# Patient Record
Sex: Female | Born: 1955
Health system: Southern US, Community
[De-identification: ages and names within clinical notes are randomized; demographics above are authoritative.]

## PROBLEM LIST (undated history)

## (undated) DIAGNOSIS — E7800 Pure hypercholesterolemia, unspecified: Secondary | ICD-10-CM

## (undated) DIAGNOSIS — F329 Major depressive disorder, single episode, unspecified: Secondary | ICD-10-CM

## (undated) DIAGNOSIS — F32A Depression, unspecified: Secondary | ICD-10-CM

## (undated) HISTORY — PX: ABDOMINAL HYSTERECTOMY: SHX81

---

## 2001-04-24 ENCOUNTER — Encounter: Payer: Self-pay | Admitting: Emergency Medicine

## 2001-04-25 ENCOUNTER — Encounter: Payer: Self-pay | Admitting: Family Medicine

## 2001-04-25 ENCOUNTER — Inpatient Hospital Stay (HOSPITAL_COMMUNITY): Admission: EM | Admit: 2001-04-25 | Discharge: 2001-04-26 | Payer: Self-pay | Admitting: Emergency Medicine

## 2002-12-15 ENCOUNTER — Ambulatory Visit (HOSPITAL_COMMUNITY): Admission: RE | Admit: 2002-12-15 | Discharge: 2002-12-15 | Payer: Self-pay | Admitting: Otolaryngology

## 2002-12-15 ENCOUNTER — Encounter: Payer: Self-pay | Admitting: Otolaryngology

## 2007-07-28 ENCOUNTER — Encounter: Admission: RE | Admit: 2007-07-28 | Discharge: 2007-08-24 | Payer: Self-pay | Admitting: Neurology

## 2010-10-17 NOTE — H&P (Signed)
[facility]  Patient:    Alice Bryant, Alice Bryant Visit Number: [id number] MRN: [id number]          Service Type: EMS Location: ED Attending Physician:  Shelba Flake Dictated by:   Tammy R. Collins Scotland, M.D. Admit Date:  04/24/2001                           History and Physical  DATE OF BIRTH:  05-17-1956  CHIEF COMPLAINT:  Headache and vomiting.  HISTORY OF PRESENT ILLNESS:  Alice Bryant is a 55 year old white female with a history of migraines who had a throbbing headache five days ago associated with vomiting and dizziness, feeling light-headed.  She was seen in the office by Lillia Dallas, our nurse practitioner, and given Flexeril and Toradol; Esgic was later called out.  It gradually resolved.  In church this morning, she suddenly became light-headed, dizzy and had a throbbing bilateral headache in the temples and she had, days ago, also had chills and vomiting.  EMS was called and transported her to the emergency department.  She was given Dilaudid and Phenergan, was feeling better but had sudden onset of the same symptoms while she was seated and waiting for the ride to pick her up.  She was then escorted back into the emergency department.  Symptoms have continued despite more medication and now she is complaining of dizziness and nausea and vomiting with movements.  She does have phonophobia and photophobia, as she does with migraines, and pain is similar to her usual migraines but worse.  PAST MEDICAL HISTORY:  Migraine headaches, hysterectomy.  ALLERGIES:  No known drug allergies, other than MOTRIN causing some confusion.  MEDICATIONS:  Cenestin, Excedrin Migraine.  FAMILY HISTORY:  Family history is positive for mom who died of a cerebral aneurysm at age 66, dad with CHF and diabetes, paternal aunt with pancreatic cancer.  SOCIAL HISTORY:  No tobacco, alcohol or drugs.  REVIEW OF SYSTEMS:  Negative for unilateral numbness or weakness.   No fever. No difficulty swallowing or visual loss or aura.  No sore throat, cough, shortness of breath, chest pain or low back pain.  She has a mild chronic right abdominal discomfort.  No edema, BRBPR, melena, hematemesis, dysuria, frequency, constipation, diarrhea or rash.  PHYSICAL EXAMINATION:  VITAL SIGNS:  97.2, 101/51, 75, 20.  GENERAL:  Drowsy.  SKIN:  Warm, dry, no rash.  HEENT:  TMs are clear.  EOMI.  PERRL.  Fundi without lesions.  No nystagmus. Oropharynx is clear, dry.  Good dentition.  NECK:  Supple.  No thyromegaly or adenopathy.  No JVD or bruits.  LUNGS:  Clear to auscultation bilaterally.  No wheezes or crackles.  BACK:  No CVAT.  CARDIOVASCULAR:  Regular rate and rhythm.  S1 and S2.  No MHR.  ABDOMEN:  Decreased breath sounds.  Mild tenderness in the right upper quadrant and right lower quadrant with deep palpation.  No HSM or masses.  No rebound.  EXTREMITIES:  Radial and DP pulses 2+.  No CCE.  NEUROLOGIC:  Slightly drowsy, oriented x 4.  Cranial nerves II-XII intact. Motor 5/5; 2+ biceps, 2+ knee and 2+ ankle DTRs.  Slight decreased sensation to fine touch over the left cheek and left jaw, left upper extremity and left lower extremity.  Slight decrease to pinprick of the left upper extremity only.  Finger-to-nose is intact.  No tremor.  No nystagmus.  Some dizziness with lateral gaze.  LABORATORY DATA:  Normal LP.  Normal CBC.  Head CT shows slightly mucosal thickening of sinus.  ASSESSMENT/PLAN:  Female with history of migraines presents with severe headache unresponsive to Dilaudid and Phenergan, with a component of vertigo and soft findings on neurological exam of slight decreased sensory on the left.  Patient may have severe atypical vertiginous migraine and/or acute labyrinthitis, possible central nervous system lesion not detected on CT. Will admit, treat symptomatically with Valium, Reglan, Phenergan, morphine. Obtain head MRI with  contrast.  Consider neurological consult.  Hold hormone replacement therapy.  Add enteric-coated aspirin. Dictated by:   Tammy R. Collins Scotland, M.D. Attending Physician:  Shelba Flake DD:  04/25/01 TD:  04/25/01 Job: [id number]

## 2010-10-17 NOTE — Discharge Summary (Signed)
[facility]  Patient:    Alice Bryant, Alice Bryant Visit Number: [id number] MRN: [id number]          Service Type: MED Location: [id number] Attending Physician:  Marily Memos Dictated by:   Anastasio Auerbach, M.D. Admit Date:  04/24/2001 Discharge Date: 04/26/2001   CC:         Alice Bryant Scotland, M.D.  Alice Bryant, M.D.   Discharge Summary  DISCHARGE DIAGNOSES: 1. Severe migraine headache.    a. History of migraines.    b. MRI/MRA:  No stroke or tumor.  Peri-sylvian nonspecific abnormality.       Petrous bone abnormality. 2. Status post hysterectomy. 3. Drug intolerance.    a. Motrin (confusion). 4. History of vertigo.  DISCHARGE MEDICATIONS: 1. Meclizine 25 mg p.o. q.8h. p.r.n. vertigo. 2. Phenergan 25 mg a half to one tab every six hours p.r.n. nausea. 3. Imitrex nasal spray, one spray for severe migraine headache, may repeat    once after two hours if needed. 4. Vicodin 5/500 one to two q.4h. p.r.n. headache (maximum -- six per day). 5. Cenestin 1 g daily as before.  CONDITION UPON DISCHARGE:  Stable.  Headache had not completely resolved but she was able to eat and drink without vomiting and wanted to go home.  RECOMMENDED ACTIVITY:  None strenuous for the next two days.  May return to work on April 29, 2001.  Do not drive while taking sedating pain medications.  RECOMMENDED DIET:  As tolerated.  Drink plenty of water.  SPECIAL INSTRUCTIONS:  Return or call if problems.  FOLLOWUP:  New appointment with neurologist:  Dr. Marlan Bryant on Tuesday, December 3rd, at 10 oclock.  The patient is to come at 9:30 and I instructed her to pick up her CT scan and MRI and take it to her appointment.  PROCEDURES: 1. Head CT:  No acute disease. 2. Lumbar puncture:  Fluid clear and colorless with one white blood cell and    one red blood cell.  CSF protein 29, CSF glucose 71.  Cultures negative. 3. MRI/MRA brain (April 25, 2001):  No evidence to  suggest ischemic    infarct.  Single punctate focal hyperintensity in the right anterior    peri-sylvian region.  This is nonspecific in terms of etiology.  Similar    changes may be associated with patients vascular headaches.  Approximately    1.2-cm hyperintensity minimally enhancing left petrous apical region.  This    may represent marrow within the petrous apex versus fluid secretions within    pneumatized petrous apex.  An epidermoid or cholesterol granuloma or    petrous apicitis are felt to be less likely.  Mild sinusitis changes in the    ethmoids and the left frontal sinuses.  An intracranial aneurysm cannot be    ruled out on this MRI exam.  Further evaluation with high-resolution MRA    examination and/or formal catheter angiography would be needed.  HOSPITAL COURSE:  Alice Bryant is a 55 year old Caucasian female with a history of migraine headaches.  She has had this headache for approximately five days and has had associated vomiting, dizziness and a light-headed feeling.  She was seen in the office and given Flexeril and Toradol.  She was later given Esgic.  It gradually resolved.  On the day of presentation, however, it came back suddenly and she became light-headed and dizzy.  The headaches were located in the bilateral temples at times but mostly all over.  She had chills and vomiting.  EMS brought her to the emergency department. She received Dilaudid and Phenergan.  CT was done, LP was done, neither of which showed any significant abnormality.  She was admitted for pain control. MRI was subsequently obtained.  Results were as described above.  Her headaches were still a 5/10 the following day.  I did try some Imitrex without success.  The patient was no longer vomiting and tolerating a diet.  We opted to send her home with Antivert for dizziness, Phenergan for nausea, and Vicodin for pain.  I also gave her a prescription for Imitrex nasal spray.  I have made her an  appointment to see Dr. Marlan Bryant on Tuesday, December 3rd, to review the MRI scans and address any further therapy of her headaches. Dictated by:   Anastasio Auerbach, M.D. Attending Physician:  Marily Memos DD:  04/28/01 TD:  04/29/01 Job: [id number]

## 2011-05-03 ENCOUNTER — Emergency Department (HOSPITAL_BASED_OUTPATIENT_CLINIC_OR_DEPARTMENT_OTHER)
Admission: EM | Admit: 2011-05-03 | Discharge: 2011-05-03 | Disposition: A | Discharge status: Home / Self Care | Payer: BC Managed Care – PPO | Attending: Emergency Medicine | Admitting: Emergency Medicine

## 2011-05-03 ENCOUNTER — Encounter: Payer: Self-pay | Admitting: *Deleted

## 2011-05-03 ENCOUNTER — Emergency Department (INDEPENDENT_AMBULATORY_CARE_PROVIDER_SITE_OTHER): Payer: BC Managed Care – PPO

## 2011-05-03 DIAGNOSIS — E7800 Pure hypercholesterolemia, unspecified: Secondary | ICD-10-CM | POA: Insufficient documentation

## 2011-05-03 DIAGNOSIS — R109 Unspecified abdominal pain: Secondary | ICD-10-CM

## 2011-05-03 DIAGNOSIS — N201 Calculus of ureter: Secondary | ICD-10-CM

## 2011-05-03 DIAGNOSIS — N1330 Unspecified hydronephrosis: Secondary | ICD-10-CM

## 2011-05-03 HISTORY — DX: Pure hypercholesterolemia, unspecified: E78.00

## 2011-05-03 HISTORY — DX: Depression, unspecified: F32.A

## 2011-05-03 HISTORY — DX: Major depressive disorder, single episode, unspecified: F32.9

## 2011-05-03 LAB — BASIC METABOLIC PANEL
BUN: 24 mg/dL — ABNORMAL HIGH (ref 6–23)
CO2: 23 mEq/L (ref 19–32)
Calcium: 9.6 mg/dL (ref 8.4–10.5)
Chloride: 106 mEq/L (ref 96–112)
Creatinine, Ser: 1.1 mg/dL (ref 0.50–1.10)
GFR calc Af Amer: 63 mL/min — ABNORMAL LOW (ref >90)
GFR calc non Af Amer: 55 mL/min — ABNORMAL LOW (ref >90)
Glucose, Bld: 142 mg/dL — ABNORMAL HIGH (ref 70–99)
Potassium: 3.7 mEq/L (ref 3.5–5.1)
Sodium: 139 mEq/L (ref 135–145)

## 2011-05-03 MED ORDER — TAMSULOSIN HCL 0.4 MG PO CAPS: 0.4000 mg | ORAL_CAPSULE | Freq: Every day | ORAL | Status: DC

## 2011-05-03 MED ORDER — HYDROMORPHONE HCL PF 1 MG/ML IJ SOLN
1.0000 mg | Freq: Once | INTRAMUSCULAR | Status: AC
  Administered 2011-05-03: 1 mg via INTRAVENOUS
  Filled 2011-05-03: qty 1

## 2011-05-03 MED ORDER — OXYCODONE-ACETAMINOPHEN 5-325 MG PO TABS: 2.0000 | ORAL_TABLET | ORAL | Status: AC | PRN

## 2011-05-03 MED ORDER — MORPHINE SULFATE 4 MG/ML IJ SOLN: 4.0000 mg | Freq: Once | INTRAMUSCULAR | Status: DC

## 2011-05-03 MED ORDER — ONDANSETRON HCL 4 MG/2ML IJ SOLN
4.0000 mg | Freq: Once | INTRAMUSCULAR | Status: AC
  Administered 2011-05-03: 4 mg via INTRAVENOUS
  Filled 2011-05-03: qty 2

## 2011-05-03 MED ORDER — HYDROMORPHONE HCL PF 1 MG/ML IJ SOLN
0.5000 mg | Freq: Once | INTRAMUSCULAR | Status: AC
  Administered 2011-05-03: 0.5 mg via INTRAVENOUS
  Filled 2011-05-03: qty 1

## 2011-05-03 NOTE — ED Provider Notes (Signed)
History     CSN: [id number] Arrival date & time: 05/03/2011  4:59 PM   First MD Initiated Contact with Patient 05/03/11 1708      Chief Complaint  Patient presents with  . Flank Pain    (Consider location/radiation/quality/duration/timing/severity/associated sxs/prior treatment) HPI Comments: Pt states that she has a kidney stone about 20 something years ago  Patient is a 55 y.o. female presenting with flank pain. The history is provided by the patient. No language interpreter was used.  Flank Pain This is a new problem. The current episode started today. The problem occurs constantly. The problem has been unchanged. Pertinent negatives include no abdominal pain, fever or vomiting. The symptoms are aggravated by nothing. Treatments tried: naproxen. The treatment provided mild relief.    Past Medical History  Diagnosis Date  . High cholesterol   . Depression   . Migraine     Past Surgical History  Procedure Date  . Abdominal hysterectomy     No family history on file.  History  Substance Use Topics  . Smoking status: Never Smoker   . Smokeless tobacco: Not on file  . Alcohol Use: No    OB History    Grav Para Term Preterm Abortions TAB SAB Ect Mult Living                  Review of Systems  Constitutional: Negative for fever.  Gastrointestinal: Negative for vomiting and abdominal pain.  Genitourinary: Positive for flank pain.  All other systems reviewed and are negative.    Allergies  Morphine and related and Nsaids  Home Medications  No current outpatient prescriptions on file.  BP 145/89  Pulse 100  Temp(Src) 97.8 F (36.6 C) (Oral)  Resp 20  Ht 5\' 8"  (1.727 m)  Wt 150 lb (68.04 kg)  BMI 22.81 kg/m2  SpO2 98%  Physical Exam  Vitals reviewed. Constitutional: She is oriented to person, place, and time. She appears well-developed and well-nourished.  HENT:  Right Ear: External ear normal.  Eyes: EOM are normal.  Neck: Normal range of motion.   Cardiovascular: Normal rate and regular rhythm.   Pulmonary/Chest: Effort normal and breath sounds normal.  Abdominal: Soft. Bowel sounds are normal. There is no tenderness. There is no CVA tenderness.  Neurological: She is alert and oriented to person, place, and time.  Skin: Skin is warm and dry.    ED Course  Procedures (including critical care time)   Labs Reviewed  BASIC METABOLIC PANEL   Ct Abdomen Pelvis Wo Contrast  05/03/2011  *RADIOLOGY REPORT*  Clinical Data: Left flank pain.  CT ABDOMEN AND PELVIS WITHOUT CONTRAST  Technique:  Multidetector CT imaging of the abdomen and pelvis was performed following the standard protocol without intravenous contrast.  Comparison: None.  Findings: Lung bases are clear.  No effusions.  Heart is normal size.  Liver, gallbladder, spleen, pancreas, adrenals and right kidney have an normal unenhanced appearance.  The left kidney is enlarged with moderate hydronephrosis and perinephric stranding.  5 mm distal left ureteral stone several centimeters proximal to the UVJ.  Right ureter is decompressed without stones.  No stones in the right kidney.  Scattered right colonic diverticula.  No diverticulitis.  Small bowel is decompressed.  Prior hysterectomy.  No adnexal masses or free fluid.  Aorta is normal caliber.  IMPRESSION: 5 mm distal left ureteral stone with moderate left hydronephrosis and perinephric stranding.  Original Report Authenticated By: Cyndie Chime, M.D.  1. Ureteral stone       MDM  Pt has urine at pcp that showed hematuria without infection:pt pain is tolerable at this time and she is ready to go home:pt afebrile:pt referred to urologist        Teressa Lower, NP 05/03/11 1936

## 2011-05-03 NOTE — ED Provider Notes (Signed)
Medical screening examination/treatment/procedure(s) were performed by non-physician practitioner and as supervising physician I was immediately available for consultation/collaboration.  Cyndra Numbers, MD 05/03/11 2118

## 2011-05-03 NOTE — ED Notes (Signed)
Pt c/o left flank pain since waking today- seen at [facility] walk-in clinic prior to arrival- sent for further eval of possible kidney stone- had UA + for blood at [facility]

## 2013-02-10 ENCOUNTER — Other Ambulatory Visit: Payer: Self-pay | Admitting: Gastroenterology

## 2016-06-10 DIAGNOSIS — K5900 Constipation, unspecified: Secondary | ICD-10-CM | POA: Diagnosis not present

## 2016-06-10 DIAGNOSIS — K649 Unspecified hemorrhoids: Secondary | ICD-10-CM | POA: Diagnosis not present

## 2016-07-16 DIAGNOSIS — R52 Pain, unspecified: Secondary | ICD-10-CM | POA: Diagnosis not present

## 2016-07-16 DIAGNOSIS — J029 Acute pharyngitis, unspecified: Secondary | ICD-10-CM | POA: Diagnosis not present

## 2016-09-01 ENCOUNTER — Other Ambulatory Visit: Payer: Self-pay

## 2016-09-01 ENCOUNTER — Encounter (HOSPITAL_COMMUNITY): Payer: Self-pay | Admitting: Emergency Medicine

## 2016-09-01 ENCOUNTER — Emergency Department (HOSPITAL_COMMUNITY): Payer: Commercial Managed Care - HMO

## 2016-09-01 ENCOUNTER — Emergency Department (HOSPITAL_COMMUNITY)
Admission: EM | Admit: 2016-09-01 | Discharge: 2016-09-01 | Disposition: A | Discharge status: Home / Self Care | Payer: Commercial Managed Care - HMO | Attending: Emergency Medicine | Admitting: Emergency Medicine

## 2016-09-01 DIAGNOSIS — R079 Chest pain, unspecified: Secondary | ICD-10-CM | POA: Diagnosis not present

## 2016-09-01 DIAGNOSIS — Z79899 Other long term (current) drug therapy: Secondary | ICD-10-CM | POA: Diagnosis not present

## 2016-09-01 DIAGNOSIS — R072 Precordial pain: Secondary | ICD-10-CM | POA: Diagnosis present

## 2016-09-01 DIAGNOSIS — R0789 Other chest pain: Secondary | ICD-10-CM

## 2016-09-01 LAB — CBC
HEMATOCRIT: 37.4 % (ref 36.0–46.0)
Hemoglobin: 11.8 g/dL — ABNORMAL LOW (ref 12.0–15.0)
MCH: 24.2 pg — AB (ref 26.0–34.0)
MCHC: 31.6 g/dL (ref 30.0–36.0)
MCV: 76.8 fL — ABNORMAL LOW (ref 78.0–100.0)
Platelets: 382 10*3/uL (ref 150–400)
RBC: 4.87 MIL/uL (ref 3.87–5.11)
RDW: 15.2 % (ref 11.5–15.5)
WBC: 8.4 10*3/uL (ref 4.0–10.5)

## 2016-09-01 LAB — BASIC METABOLIC PANEL WITH GFR
Anion gap: 9 (ref 5–15)
BUN: 19 mg/dL (ref 6–20)
CO2: 26 mmol/L (ref 22–32)
Calcium: 9.1 mg/dL (ref 8.9–10.3)
Chloride: 104 mmol/L (ref 101–111)
Creatinine, Ser: 0.82 mg/dL (ref 0.44–1.00)
GFR calc Af Amer: >60 mL/min
GFR calc non Af Amer: >60 mL/min
Glucose, Bld: 110 mg/dL — ABNORMAL HIGH (ref 65–99)
Potassium: 4.1 mmol/L (ref 3.5–5.1)
Sodium: 139 mmol/L (ref 135–145)

## 2016-09-01 LAB — I-STAT TROPONIN, ED: TROPONIN I, POC: 0 ng/mL (ref 0.00–0.08)

## 2016-09-01 NOTE — ED Provider Notes (Signed)
[facility]-EMERGENCY DEPT Provider Note   CSN: [id number] Arrival date & time: 09/01/16  1124   By signing my name below, I, Soijett Blue, attest that this documentation has been prepared under the direction and in the presence of Margarita Grizzle, MD. Electronically Signed: Soijett Blue, ED Scribe. 09/01/16. 1:37 PM.  History   Chief Complaint Chief Complaint  Patient presents with  . Chest Pain    HPI Alice Bryant is a 61 y.o. female with a PMHx of high cholesterol, who presents to the Emergency Department complaining of 9/10, sharp, constant sternal-to-left sided CP onset 6 PM last night. She notes that she was relaxing when the CP began and notes that her CP radiates to her left arm and left sided neck. Pt reports that there are no modifying factors for her CP. She states that she had a prior episode of CP 6 days ago that lasted for one hour. Pt reports associated cough x 2 days. Pt has not tried any medications for the relief of her symptoms. Denies SOB, fever, chills, and any other symptoms. She states that she takes zoloft daily. Denies any pertinent PMHx, hx of bronchitis, hx of DVT or PE, smoking cigarettes, ETOH use, or allergies to medications.    The history is provided by the patient. No language interpreter was used.    Past Medical History:  Diagnosis Date  . Depression   . High cholesterol   . Migraine     There are no active problems to display for this patient.   Past Surgical History:  Procedure Laterality Date  . ABDOMINAL HYSTERECTOMY      OB History    No data available       Home Medications    Prior to Admission medications   Medication Sig Start Date End Date Taking? Authorizing Provider  budesonide (ENTOCORT EC) 3 MG 24 hr capsule Take 3 mg by mouth 3 (three) times daily. 07/30/16   Historical Provider, MD  cholecalciferol (VITAMIN D) 1000 UNITS tablet Take 1,000 Units by mouth daily.      Historical Provider, MD  naproxen sodium (ANAPROX) 220 MG tablet  Take 440 mg by mouth 2 (two) times daily with a meal. For pain     Historical Provider, MD  sertraline (ZOLOFT) 100 MG tablet Take 150 mg by mouth daily. 08/06/16   Historical Provider, MD  Tamsulosin HCl (FLOMAX) 0.4 MG CAPS Take 1 capsule (0.4 mg total) by mouth daily. 05/03/11   Teressa Lower, NP  topiramate (TOPAMAX) 200 MG tablet Take 200 mg by mouth daily.      Historical Provider, MD  Venlafaxine HCl (EFFEXOR XR PO) Take 1 capsule by mouth daily.      Historical Provider, MD  Vitamin D, Ergocalciferol, (DRISDOL) 50000 UNITS CAPS Take 50,000 Units by mouth every 7 (seven) days.      Historical Provider, MD    Family History History reviewed. No pertinent family history.  Social History Social History  Substance Use Topics  . Smoking status: Never Smoker  . Smokeless tobacco: Not on file  . Alcohol use No     Allergies   Ibuprofen and Morphine and related   Review of Systems Review of Systems  Constitutional: Negative for chills and fever.  Respiratory: Positive for cough. Negative for shortness of breath.   Cardiovascular: Positive for chest pain.  All other systems reviewed and are negative.    Physical Exam Updated Vital Signs BP 134/65 (BP Location: Left Arm)   Pulse 88  Temp 98.2 F (36.8 C) (Oral)   Resp 16   Ht  (1.727 m)   Wt 164 lb (74.4 kg)   SpO2 93%   BMI 24.94 kg/m   Physical Exam  Constitutional: She is oriented to person, place, and time. She appears well-developed and well-nourished. No distress.  HENT:  Head: Normocephalic and atraumatic.  Eyes: EOM are normal.  Neck: Neck supple.  Cardiovascular: Normal rate, regular rhythm and normal heart sounds.  Exam reveals no gallop and no friction rub.   No murmur heard. Pulmonary/Chest: Effort normal and breath sounds normal. No respiratory distress. She has no wheezes. She has no rales. She exhibits tenderness.  Reproducible chest wall tenderness.   Abdominal: Soft. She exhibits no  distension. There is no tenderness.  Musculoskeletal: Normal range of motion.  Neurological: She is alert and oriented to person, place, and time.  Skin: Skin is warm and dry.  Psychiatric: She has a normal mood and affect. Her behavior is normal.  Nursing note and vitals reviewed.    ED Treatments / Results  DIAGNOSTIC STUDIES: Oxygen Saturation is 93% on RA, low by my interpretation.    COORDINATION OF CARE: 1:19 PM Discussed treatment plan with pt at bedside which includes labs, EKG, CXR, and pt agreed to plan.   Labs (all labs ordered are listed, but only abnormal results are displayed) Labs Reviewed  BASIC METABOLIC PANEL - Abnormal; Notable for the following:       Result Value   Glucose, Bld 110 (*)    All other components within normal limits  CBC - Abnormal; Notable for the following:    Hemoglobin 11.8 (*)    MCV 76.8 (*)    MCH 24.2 (*)    All other components within normal limits  I-STAT TROPOININ, ED    EKG  EKG Interpretation  Date/Time:  Tuesday September 01 2016 11:32:01 EDT Ventricular Rate:  102 PR Interval:  136 QRS Duration: 60 QT Interval:  336 QTC Calculation: 437 R Axis:   -6 Text Interpretation:  Sinus tachycardia Cannot rule out Anterior infarct , age undetermined Abnormal ECG Confirmed by Delford Wingert MD, Duwayne Heck [id number]) on 09/01/2016 12:58:29 PM       Radiology Dg Chest 2 View  Result Date: 09/01/2016 CLINICAL DATA:  Mid sternal to left side chest pain EXAM: CHEST  2 VIEW COMPARISON:  None FINDINGS: The heart size and mediastinal contours are within normal limits. Both lungs are clear. The visualized skeletal structures are unremarkable. IMPRESSION: No active cardiopulmonary disease. Electronically Signed   By: Signa Kell M.D.   On: 09/01/2016 12:01    Procedures Procedures (including critical care time)  Medications Ordered in ED Medications - No data to display   Initial Impression / Assessment and Plan / ED Course  I have reviewed the  triage vital signs and the nursing notes.  Pertinent labs & imaging results that were available during my care of the patient were reviewed by me and considered in my medical decision making (see chart for details).       Final Clinical Impressions(s) / ED Diagnoses   Final diagnoses:  Chest wall pain    New Prescriptions New Prescriptions   No medications on file   I personally performed the services described in this documentation, which was scribed in my presence. The recorded information has been reviewed and considered.    Margarita Grizzle, MD 09/01/16 [phone]

## 2016-09-01 NOTE — ED Triage Notes (Signed)
Pt sts mid sternal to left sided CP worse with cough; pt sts some URI sx

## 2016-09-07 DIAGNOSIS — K529 Noninfective gastroenteritis and colitis, unspecified: Secondary | ICD-10-CM | POA: Diagnosis not present

## 2016-09-07 DIAGNOSIS — R1013 Epigastric pain: Secondary | ICD-10-CM | POA: Diagnosis not present

## 2016-09-07 DIAGNOSIS — R1310 Dysphagia, unspecified: Secondary | ICD-10-CM | POA: Diagnosis not present

## 2016-11-24 DIAGNOSIS — J069 Acute upper respiratory infection, unspecified: Secondary | ICD-10-CM | POA: Diagnosis not present

## 2016-11-30 ENCOUNTER — Other Ambulatory Visit: Payer: Self-pay | Admitting: Internal Medicine

## 2016-11-30 DIAGNOSIS — J0140 Acute pansinusitis, unspecified: Secondary | ICD-10-CM

## 2016-11-30 DIAGNOSIS — J329 Chronic sinusitis, unspecified: Secondary | ICD-10-CM | POA: Diagnosis not present

## 2016-12-04 ENCOUNTER — Ambulatory Visit
Admission: RE | Admit: 2016-12-04 | Discharge: 2016-12-04 | Disposition: A | Discharge status: Home / Self Care | Payer: Commercial Managed Care - HMO | Source: Ambulatory Visit | Attending: Internal Medicine | Admitting: Internal Medicine

## 2016-12-04 DIAGNOSIS — J0140 Acute pansinusitis, unspecified: Secondary | ICD-10-CM

## 2016-12-04 DIAGNOSIS — J0100 Acute maxillary sinusitis, unspecified: Secondary | ICD-10-CM | POA: Diagnosis not present

## 2016-12-08 DIAGNOSIS — J329 Chronic sinusitis, unspecified: Secondary | ICD-10-CM | POA: Diagnosis not present

## 2017-01-05 DIAGNOSIS — M2669 Other specified disorders of temporomandibular joint: Secondary | ICD-10-CM | POA: Diagnosis not present

## 2017-01-05 DIAGNOSIS — K219 Gastro-esophageal reflux disease without esophagitis: Secondary | ICD-10-CM | POA: Diagnosis not present

## 2017-01-05 DIAGNOSIS — J342 Deviated nasal septum: Secondary | ICD-10-CM | POA: Diagnosis not present

## 2017-02-04 DIAGNOSIS — R278 Other lack of coordination: Secondary | ICD-10-CM | POA: Diagnosis not present

## 2017-02-04 DIAGNOSIS — M6281 Muscle weakness (generalized): Secondary | ICD-10-CM | POA: Diagnosis not present

## 2017-02-04 DIAGNOSIS — M62838 Other muscle spasm: Secondary | ICD-10-CM | POA: Diagnosis not present

## 2017-02-05 DIAGNOSIS — H6503 Acute serous otitis media, bilateral: Secondary | ICD-10-CM | POA: Diagnosis not present

## 2017-03-01 DIAGNOSIS — Z1231 Encounter for screening mammogram for malignant neoplasm of breast: Secondary | ICD-10-CM | POA: Diagnosis not present

## 2017-03-01 DIAGNOSIS — M8589 Other specified disorders of bone density and structure, multiple sites: Secondary | ICD-10-CM | POA: Diagnosis not present

## 2017-03-01 DIAGNOSIS — M810 Age-related osteoporosis without current pathological fracture: Secondary | ICD-10-CM | POA: Diagnosis not present

## 2017-03-02 DIAGNOSIS — Z719 Counseling, unspecified: Secondary | ICD-10-CM | POA: Diagnosis not present

## 2017-03-05 DIAGNOSIS — J069 Acute upper respiratory infection, unspecified: Secondary | ICD-10-CM | POA: Diagnosis not present

## 2017-06-07 DIAGNOSIS — R05 Cough: Secondary | ICD-10-CM | POA: Diagnosis not present

## 2017-06-07 DIAGNOSIS — E785 Hyperlipidemia, unspecified: Secondary | ICD-10-CM | POA: Diagnosis not present

## 2017-06-07 DIAGNOSIS — E559 Vitamin D deficiency, unspecified: Secondary | ICD-10-CM | POA: Diagnosis not present

## 2017-06-07 DIAGNOSIS — J209 Acute bronchitis, unspecified: Secondary | ICD-10-CM | POA: Diagnosis not present

## 2017-06-07 DIAGNOSIS — R0989 Other specified symptoms and signs involving the circulatory and respiratory systems: Secondary | ICD-10-CM | POA: Diagnosis not present

## 2017-06-22 DIAGNOSIS — M810 Age-related osteoporosis without current pathological fracture: Secondary | ICD-10-CM | POA: Diagnosis not present

## 2017-06-22 DIAGNOSIS — H44812 Hemophthalmos, left eye: Secondary | ICD-10-CM | POA: Diagnosis not present

## 2017-06-23 DIAGNOSIS — H2512 Age-related nuclear cataract, left eye: Secondary | ICD-10-CM | POA: Diagnosis not present

## 2017-06-23 DIAGNOSIS — H43821 Vitreomacular adhesion, right eye: Secondary | ICD-10-CM | POA: Diagnosis not present

## 2017-06-23 DIAGNOSIS — H2511 Age-related nuclear cataract, right eye: Secondary | ICD-10-CM | POA: Diagnosis not present

## 2017-06-28 DIAGNOSIS — J0101 Acute recurrent maxillary sinusitis: Secondary | ICD-10-CM | POA: Diagnosis not present

## 2017-07-22 DIAGNOSIS — H47022 Hemorrhage in optic nerve sheath, left eye: Secondary | ICD-10-CM | POA: Diagnosis not present

## 2017-07-22 DIAGNOSIS — H53452 Other localized visual field defect, left eye: Secondary | ICD-10-CM | POA: Diagnosis not present

## 2017-07-28 DIAGNOSIS — H5340 Unspecified visual field defects: Secondary | ICD-10-CM | POA: Diagnosis not present

## 2017-08-23 DIAGNOSIS — Z719 Counseling, unspecified: Secondary | ICD-10-CM | POA: Diagnosis not present

## 2017-09-01 DIAGNOSIS — K219 Gastro-esophageal reflux disease without esophagitis: Secondary | ICD-10-CM | POA: Diagnosis not present

## 2017-09-03 DIAGNOSIS — Z719 Counseling, unspecified: Secondary | ICD-10-CM | POA: Diagnosis not present

## 2017-09-06 DIAGNOSIS — Z719 Counseling, unspecified: Secondary | ICD-10-CM | POA: Diagnosis not present

## 2017-10-04 DIAGNOSIS — R142 Eructation: Secondary | ICD-10-CM | POA: Diagnosis not present

## 2017-10-04 DIAGNOSIS — K219 Gastro-esophageal reflux disease without esophagitis: Secondary | ICD-10-CM | POA: Diagnosis not present

## 2017-11-23 ENCOUNTER — Ambulatory Visit
Admission: RE | Admit: 2017-11-23 | Discharge: 2017-11-23 | Disposition: A | Discharge status: Home / Self Care | Payer: Commercial Managed Care - HMO | Source: Ambulatory Visit | Attending: Internal Medicine | Admitting: Internal Medicine

## 2017-11-23 ENCOUNTER — Other Ambulatory Visit: Payer: Self-pay | Admitting: Internal Medicine

## 2017-11-23 DIAGNOSIS — R0600 Dyspnea, unspecified: Secondary | ICD-10-CM

## 2017-11-23 DIAGNOSIS — J9811 Atelectasis: Secondary | ICD-10-CM | POA: Diagnosis not present

## 2017-11-23 DIAGNOSIS — D649 Anemia, unspecified: Secondary | ICD-10-CM | POA: Diagnosis not present

## 2017-11-23 DIAGNOSIS — R0609 Other forms of dyspnea: Principal | ICD-10-CM

## 2017-11-23 DIAGNOSIS — R5383 Other fatigue: Secondary | ICD-10-CM | POA: Diagnosis not present

## 2017-11-25 ENCOUNTER — Ambulatory Visit: Payer: 59 | Admitting: Interventional Cardiology

## 2017-11-25 ENCOUNTER — Encounter: Payer: Self-pay | Admitting: Interventional Cardiology

## 2017-11-25 VITALS — BP 140/78 | HR 94 | Ht 68.0 in | Wt 178.0 lb

## 2017-11-25 DIAGNOSIS — E782 Mixed hyperlipidemia: Secondary | ICD-10-CM | POA: Diagnosis not present

## 2017-11-25 DIAGNOSIS — D508 Other iron deficiency anemias: Secondary | ICD-10-CM | POA: Diagnosis not present

## 2017-11-25 DIAGNOSIS — R0609 Other forms of dyspnea: Secondary | ICD-10-CM | POA: Diagnosis not present

## 2017-11-25 DIAGNOSIS — R9389 Abnormal findings on diagnostic imaging of other specified body structures: Secondary | ICD-10-CM | POA: Diagnosis not present

## 2017-11-25 DIAGNOSIS — R0602 Shortness of breath: Secondary | ICD-10-CM

## 2017-11-25 NOTE — Patient Instructions (Signed)
Medication Instructions:  Your physician recommends that you continue on your current medications as directed. Please refer to the Current Medication list given to you today.   Labwork: None ordered  Testing/Procedures: Your physician has requested that you have an echocardiogram. Echocardiography is a painless test that uses sound waves to create images of your heart. It provides your doctor with information about the size and shape of your heart and how well your heart's chambers and valves are working. This procedure takes approximately one hour. There are no restrictions for this procedure.  Follow-Up: Your physician wants you to follow-up AS NEEDED   Any Other Special Instructions Will Be Listed Below (If Applicable).  Echocardiogram An echocardiogram, or echocardiography, uses sound waves (ultrasound) to produce an image of your heart. The echocardiogram is simple, painless, obtained within a short period of time, and offers valuable information to your health care provider. The images from an echocardiogram can provide information such as:  Evidence of coronary artery disease (CAD).  Heart size.  Heart muscle function.  Heart valve function.  Aneurysm detection.  Evidence of a past heart attack.  Fluid buildup around the heart.  Heart muscle thickening.  Assess heart valve function.  Tell a health care provider about:  Any allergies you have.  All medicines you are taking, including vitamins, herbs, eye drops, creams, and over-the-counter medicines.  Any problems you or family members have had with anesthetic medicines.  Any blood disorders you have.  Any surgeries you have had.  Any medical conditions you have.  Whether you are pregnant or may be pregnant. What happens before the procedure? No special preparation is needed. Eat and drink normally. What happens during the procedure?  In order to produce an image of your heart, gel will be applied to your  chest and a wand-like tool (transducer) will be moved over your chest. The gel will help transmit the sound waves from the transducer. The sound waves will harmlessly bounce off your heart to allow the heart images to be captured in real-time motion. These images will then be recorded.  You may need an IV to receive a medicine that improves the quality of the pictures. What happens after the procedure? You may return to your normal schedule including diet, activities, and medicines, unless your health care provider tells you otherwise. This information is not intended to replace advice given to you by your health care provider. Make sure you discuss any questions you have with your health care provider. Document Released: 05/15/2000 Document Revised: 01/04/2016 Document Reviewed: 01/23/2013 Elsevier Interactive Patient Education  2017 [employer]    If you need a refill on your cardiac medications before your next appointment, please call your pharmacy.   

## 2017-11-25 NOTE — Progress Notes (Signed)
Cardiology Office Note   Date:  11/25/2017   ID:  Alice SarnaLethia Zeimet, DOB 1955/11/01, MRN [id number]  PCP:  Kendrick RanchSchoenhoff, Deborah D, MD    No chief complaint on file.  Shortness of breath  Wt Readings from Last 3 Encounters:  11/25/17 178 lb (80.7 kg)  09/01/16 164 lb (74.4 kg)  05/03/11 150 lb (68 kg)       History of Present Illness: Alice Bryant is a 62 y.o. female who is being seen today for the evaluation of SHOB at the request of Schoenhoff, Harrington ChallengerDeborah D, *.  On 11/18/17 She was in a warm room working and felt overheated.  She left the room after she got nauseated and felt that she could not get her breath.  EMS was called and her BP was high, 197 systolic.  Blood sugar was increased.  ECG was ok.  She did not go to the hospital.    She felt fatigued until the next day.    Since then, she feels tired but otherwise ok.  She has some DOE; she can't get a good breath.  Anemia was found and is being worked up.  Last Hbg 10.4.  No bleeding problems noted except from what she thought was hemorrhoids.   Colonoscopy up to date.        Past Medical History:  Diagnosis Date  . Depression   . High cholesterol   . Migraine     Past Surgical History:  Procedure Laterality Date  . ABDOMINAL HYSTERECTOMY       Current Outpatient Medications  Medication Sig Dispense Refill  . cholecalciferol (VITAMIN D) 1000 UNITS tablet Take 1,000 Units by mouth daily.      . Omega-3 Fatty Acids (FISH OIL) 500 MG CAPS Take 1 capsule by mouth daily.    Marland Kitchen. omeprazole (PRILOSEC) 20 MG capsule Take 20 mg by mouth 2 (two) times daily.  12  . ranitidine (ZANTAC) 150 MG tablet Take 150 mg by mouth 2 (two) times daily.    . sertraline (ZOLOFT) 100 MG tablet Take 150 mg by mouth daily.    . vitamin B-12 (CYANOCOBALAMIN) 500 MCG tablet Take 500 mcg by mouth daily.     No current facility-administered medications for this visit.     Allergies:   Cetirizine; Ibuprofen; Morphine and related; and  Prednisone    Social History:  The patient  reports that she has never smoked. She has never used smokeless tobacco. She reports that she does not drink alcohol or use drugs.   Family History:  The patient's family history includes Heart attack in her brother; Heart failure in her father.    ROS:  Please see the history of present illness.   Otherwise, review of systems are [facility].   All other systems are reviewed and negative.    PHYSICAL EXAM: VS:  BP 140/78   Pulse 94   Ht 5\' 8"  (1.727 m)   Wt 178 lb (80.7 kg)   BMI 27.06 kg/m  , BMI Body mass index is 27.06 kg/m. GEN: Well nourished, well developed, in no acute distress  HEENT: normal  Neck: no JVD, carotid bruits, or masses Cardiac: RRR; no murmurs, rubs, or gallops,no edema  Respiratory:  clear to auscultation bilaterally, normal work of breathing GI: soft, nontender, nondistended, + BS MS: no deformity or atrophy  Skin: warm and dry, no rash Neuro:  Strength and sensation are intact Psych: euthymic mood, full affect   EKG:  The ekg ordered today demonstrates NSR, PVC, lateral ST changes- similar to prior   Recent Labs: No results found for requested labs within last 8760 hours.   Lipid Panel No results found for: CHOL, TRIG, HDL, CHOLHDL, VLDL, LDLCALC, LDLDIRECT   Other studies Reviewed: Additional studies/ records that were reviewed today with results demonstrating: Low risk nuclear stress test in 2013.    ASSESSMENT AND PLAN:  1. Shortness of breath: Unclear etiology. Check echo to eval for structural heart disease.  May be related to anemia.  Regular exercise will be helpful as well.  No clear angina.   2. Hyperlipidemia: LDL 157.  Trying to manag ewith lifestyle changes.    Current medicines are reviewed at length with the patient today.  The patient concerns regarding her medicines were addressed.  The following changes have been made:  No change  Labs/ tests ordered today  include:  No orders of the defined types were placed in this encounter.   Recommend 150 minutes/week of aerobic exercise Low fat, low carb, high fiber diet recommended  Disposition:   FU for echo   Signed, Lance Muss, MD  11/25/2017 11:55 AM    [facility] [address] [place], [place], [place]  [address] Phone: [phone]; Fax: [phone]

## 2017-11-26 ENCOUNTER — Other Ambulatory Visit: Payer: Self-pay

## 2017-11-26 ENCOUNTER — Ambulatory Visit (HOSPITAL_COMMUNITY): Payer: 59 | Attending: Cardiovascular Disease

## 2017-11-26 DIAGNOSIS — I340 Nonrheumatic mitral (valve) insufficiency: Secondary | ICD-10-CM | POA: Insufficient documentation

## 2017-11-26 DIAGNOSIS — R0602 Shortness of breath: Secondary | ICD-10-CM

## 2017-11-26 DIAGNOSIS — E785 Hyperlipidemia, unspecified: Secondary | ICD-10-CM | POA: Diagnosis not present

## 2017-11-29 ENCOUNTER — Other Ambulatory Visit: Payer: Self-pay | Admitting: Internal Medicine

## 2017-11-29 DIAGNOSIS — R9389 Abnormal findings on diagnostic imaging of other specified body structures: Secondary | ICD-10-CM

## 2017-11-29 DIAGNOSIS — D508 Other iron deficiency anemias: Secondary | ICD-10-CM | POA: Diagnosis not present

## 2017-11-29 DIAGNOSIS — R0609 Other forms of dyspnea: Secondary | ICD-10-CM | POA: Diagnosis not present

## 2017-12-09 ENCOUNTER — Ambulatory Visit
Admission: RE | Admit: 2017-12-09 | Discharge: 2017-12-09 | Disposition: A | Discharge status: Home / Self Care | Payer: 59 | Source: Ambulatory Visit | Attending: Internal Medicine | Admitting: Internal Medicine

## 2017-12-09 DIAGNOSIS — R0602 Shortness of breath: Secondary | ICD-10-CM | POA: Diagnosis not present

## 2017-12-09 DIAGNOSIS — R9389 Abnormal findings on diagnostic imaging of other specified body structures: Secondary | ICD-10-CM

## 2017-12-09 MED ORDER — IOPAMIDOL (ISOVUE-300) INJECTION 61%
75.0000 mL | Freq: Once | INTRAVENOUS | Status: AC | PRN
  Administered 2017-12-09: 75 mL via INTRAVENOUS

## 2017-12-15 DIAGNOSIS — R195 Other fecal abnormalities: Secondary | ICD-10-CM | POA: Diagnosis not present

## 2017-12-15 DIAGNOSIS — D508 Other iron deficiency anemias: Secondary | ICD-10-CM | POA: Diagnosis not present

## 2017-12-15 DIAGNOSIS — R1011 Right upper quadrant pain: Secondary | ICD-10-CM | POA: Diagnosis not present

## 2017-12-15 DIAGNOSIS — R9389 Abnormal findings on diagnostic imaging of other specified body structures: Secondary | ICD-10-CM | POA: Diagnosis not present

## 2017-12-15 DIAGNOSIS — K219 Gastro-esophageal reflux disease without esophagitis: Secondary | ICD-10-CM | POA: Diagnosis not present

## 2017-12-15 DIAGNOSIS — D500 Iron deficiency anemia secondary to blood loss (chronic): Secondary | ICD-10-CM | POA: Diagnosis not present

## 2017-12-20 ENCOUNTER — Other Ambulatory Visit: Payer: Self-pay | Admitting: Physician Assistant

## 2017-12-20 DIAGNOSIS — R195 Other fecal abnormalities: Secondary | ICD-10-CM

## 2017-12-20 DIAGNOSIS — D500 Iron deficiency anemia secondary to blood loss (chronic): Secondary | ICD-10-CM

## 2017-12-21 DIAGNOSIS — K449 Diaphragmatic hernia without obstruction or gangrene: Secondary | ICD-10-CM | POA: Diagnosis not present

## 2017-12-21 DIAGNOSIS — D509 Iron deficiency anemia, unspecified: Secondary | ICD-10-CM | POA: Diagnosis not present

## 2018-01-07 ENCOUNTER — Ambulatory Visit: Payer: 59 | Admitting: Podiatry

## 2018-02-21 DIAGNOSIS — Z780 Asymptomatic menopausal state: Secondary | ICD-10-CM | POA: Diagnosis not present

## 2018-02-21 DIAGNOSIS — D500 Iron deficiency anemia secondary to blood loss (chronic): Secondary | ICD-10-CM | POA: Diagnosis not present

## 2018-02-28 DIAGNOSIS — D508 Other iron deficiency anemias: Secondary | ICD-10-CM | POA: Diagnosis not present

## 2018-02-28 DIAGNOSIS — K219 Gastro-esophageal reflux disease without esophagitis: Secondary | ICD-10-CM | POA: Diagnosis not present

## 2018-02-28 DIAGNOSIS — R142 Eructation: Secondary | ICD-10-CM | POA: Diagnosis not present

## 2018-03-03 DIAGNOSIS — Z1231 Encounter for screening mammogram for malignant neoplasm of breast: Secondary | ICD-10-CM | POA: Diagnosis not present

## 2018-06-15 DIAGNOSIS — E559 Vitamin D deficiency, unspecified: Secondary | ICD-10-CM | POA: Diagnosis not present

## 2018-06-15 DIAGNOSIS — E785 Hyperlipidemia, unspecified: Secondary | ICD-10-CM | POA: Diagnosis not present

## 2018-06-15 DIAGNOSIS — Z23 Encounter for immunization: Secondary | ICD-10-CM | POA: Diagnosis not present

## 2018-06-15 DIAGNOSIS — Z0000 Encounter for general adult medical examination without abnormal findings: Secondary | ICD-10-CM | POA: Diagnosis not present

## 2018-06-15 DIAGNOSIS — D508 Other iron deficiency anemias: Secondary | ICD-10-CM | POA: Diagnosis not present

## 2018-06-20 DIAGNOSIS — E7800 Pure hypercholesterolemia, unspecified: Secondary | ICD-10-CM | POA: Diagnosis not present

## 2018-07-04 DIAGNOSIS — H2513 Age-related nuclear cataract, bilateral: Secondary | ICD-10-CM | POA: Diagnosis not present

## 2018-07-04 DIAGNOSIS — H04123 Dry eye syndrome of bilateral lacrimal glands: Secondary | ICD-10-CM | POA: Diagnosis not present

## 2018-07-04 DIAGNOSIS — H53452 Other localized visual field defect, left eye: Secondary | ICD-10-CM | POA: Diagnosis not present

## 2018-08-02 DIAGNOSIS — N644 Mastodynia: Secondary | ICD-10-CM | POA: Diagnosis not present

## 2018-08-02 DIAGNOSIS — E785 Hyperlipidemia, unspecified: Secondary | ICD-10-CM | POA: Diagnosis not present

## 2018-08-02 DIAGNOSIS — E663 Overweight: Secondary | ICD-10-CM | POA: Diagnosis not present

## 2018-09-06 DIAGNOSIS — N6320 Unspecified lump in the left breast, unspecified quadrant: Secondary | ICD-10-CM | POA: Diagnosis not present

## 2018-09-06 DIAGNOSIS — N6325 Unspecified lump in the left breast, overlapping quadrants: Secondary | ICD-10-CM | POA: Diagnosis not present

## 2018-09-20 DIAGNOSIS — H05011 Cellulitis of right orbit: Secondary | ICD-10-CM | POA: Diagnosis not present

## 2018-09-26 DIAGNOSIS — D500 Iron deficiency anemia secondary to blood loss (chronic): Secondary | ICD-10-CM | POA: Diagnosis not present

## 2018-09-26 DIAGNOSIS — E7800 Pure hypercholesterolemia, unspecified: Secondary | ICD-10-CM | POA: Diagnosis not present

## 2018-09-26 DIAGNOSIS — N644 Mastodynia: Secondary | ICD-10-CM | POA: Diagnosis not present

## 2020-05-16 ENCOUNTER — Encounter: Payer: Self-pay | Admitting: Orthopedic Surgery

## 2020-05-16 ENCOUNTER — Ambulatory Visit: Payer: Self-pay

## 2020-05-16 ENCOUNTER — Ambulatory Visit: Payer: 59 | Admitting: Orthopedic Surgery

## 2020-05-16 DIAGNOSIS — M79605 Pain in left leg: Secondary | ICD-10-CM | POA: Diagnosis not present

## 2020-05-16 DIAGNOSIS — M79604 Pain in right leg: Secondary | ICD-10-CM

## 2020-05-16 DIAGNOSIS — S82122A Displaced fracture of lateral condyle of left tibia, initial encounter for closed fracture: Secondary | ICD-10-CM | POA: Diagnosis not present

## 2020-05-16 DIAGNOSIS — M25561 Pain in right knee: Secondary | ICD-10-CM

## 2020-05-16 DIAGNOSIS — M170 Bilateral primary osteoarthritis of knee: Secondary | ICD-10-CM

## 2020-05-16 NOTE — Progress Notes (Signed)
Office Visit Note   Patient: Alice Bryant           Date of Birth: [birth year]           MRN: [id number] Visit Date: 05/16/2020              Requested by: Kendrick Ranch, MD [address] [place],  [place] [address] PCP: Kendrick Ranch, MD  Chief Complaint  Patient presents with  . Right Knee - Pain    S/p fall down steps on 05/11/20 while out of town   . Left Knee - Pain  . Right Ankle - Pain  . Left Ankle - Pain      HPI: Patient is a 64 year old woman who states that she fell Saturday in [place] when she was going [place] the steps to [place] [employer] while holding a baby.  She did go to urgent care radiographs were obtained she was told she had a fracture in her right knee and was placed in a fracture boot on the right.  Patient complains of pain in both legs.  Patient states the left foot is swelling in the lateral side of the calf is bruised with a hard knot.  She states she cannot feel her toes due to the swelling in the left leg.  Assessment & Plan: Visit Diagnoses:  1. Acute pain of right knee   2. Bilateral leg pain   3. Bilateral primary osteoarthritis of knee   4. Closed fracture of lateral portion of left tibial plateau, initial encounter     Plan: Recommended that she wear the fracture boot on the left minimize weightbearing repeat radiographs in 4 weeks.  Discussed the importance of elevation and moving the ankle to decrease swelling.  Discussed that the swelling increases or the numbness increases to follow-up for evaluation of compartment syndrome.  Recommended touchdown weightbearing on the left with crutches.  Discussed that her fracture is on the left side not the right side.  Follow-Up Instructions: Return in about 4 weeks (around 06/13/2020).   Ortho Exam  Patient is alert, oriented, no adenopathy, well-dressed, normal affect, normal respiratory effort. Examination patient's both calfs are soft no pain with range of motion of the ankle  no signs of a compartment syndrome in either leg.  Patient has ecchymosis and bruising around the left knee and abrasions over the right knee.  There is no pain to palpation around either ankle no evidence of a syndesmosis injury.  She is exquisitely tender to palpation around the left knee greater than the right.  Radiographs show a definite left proximal fibular fracture and one of the views were suggestive of a tibial plateau fracture however the other views did not show it.  Since patient has been able to ambulate full weightbearing on the left this probably should not displace any further.  The joint is congruent.  Imaging: XR Tibia/Fibula Left  Result Date: 05/16/2020 2 view radiographs of the left tibia and fibula shows joint space irregularity of the lateral tibial plateau with a proximal left fibula fracture.  XR Knee 1-2 Views Right  Result Date: 05/16/2020 2 view radiographs of the right knee shows no fractures a congruent joint space.  XR KNEE 3 VIEW LEFT  Result Date: 05/16/2020 Three-view radiographs of the left knee that were dedicated radiographs did not show the joint space abnormality over the lateral joint line that was apparent on the AP bilateral knee x-ray.  XR KNEE 3 VIEW  RIGHT  Result Date: 05/16/2020 Three-view radiographs of the right knee shows no fractures no fracture of the proximal fibula the joint space is congruent no tibial plateau fracture.  XR Tibia/Fibula Right  Result Date: 05/16/2020 2 view radiographs of the right tibia and fibula shows no fractures.  No images are attached to the encounter.  Labs: No results found for: HGBA1C, ESRSEDRATE, CRP, LABURIC, REPTSTATUS, GRAMSTAIN, CULT, LABORGA   No results found for: ALBUMIN, PREALBUMIN, LABURIC  No results found for: MG No results found for: VD25OH  No results found for: PREALBUMIN CBC EXTENDED Latest Ref Rng & Units 09/01/2016  WBC 4.0 - 10.5 K/uL 8.4  RBC 3.87 - 5.11 MIL/uL 4.87  HGB 12.0  - 15.0 g/dL 11.8(L)  HCT 36.0 - 46.0 % 37.4  PLT 150 - 400 K/uL 382     There is no height or weight on file to calculate BMI.  Orders:  Orders Placed This Encounter  Procedures  . XR Knee 1-2 Views Right  . XR Tibia/Fibula Right  . XR Tibia/Fibula Left  . XR KNEE 3 VIEW RIGHT  . XR KNEE 3 VIEW LEFT   No orders of the defined types were placed in this encounter.    Procedures: No procedures performed  Clinical Data: No additional findings.  ROS:  All other systems negative, except as noted in the HPI. Review of Systems  Objective: Vital Signs: There were no vitals taken for this visit.  Specialty Comments:  No specialty comments available.  PMFS History: There are no problems to display for this patient.  Past Medical History:  Diagnosis Date  . Depression   . High cholesterol   . Migraine     Family History  Problem Relation Age of Onset  . Heart failure Father   . Heart attack Brother     Past Surgical History:  Procedure Laterality Date  . ABDOMINAL HYSTERECTOMY     Social History   Occupational History  . Not on file  Tobacco Use  . Smoking status: Never Smoker  . Smokeless tobacco: Never Used  Substance and Sexual Activity  . Alcohol use: No  . Drug use: No  . Sexual activity: Not on file

## 2020-05-20 ENCOUNTER — Telehealth: Payer: Self-pay | Admitting: Orthopedic Surgery

## 2020-05-20 NOTE — Telephone Encounter (Signed)
Called pt and she states that she is having continued right knee pain even though all of the injuries on the left. Appt made for tomorrow to re-eval with Dr. Lajoyce Corners.

## 2020-05-20 NOTE — Telephone Encounter (Signed)
Patient called needing clarification on what Dr Lajoyce Corners told her last week during her appointment?  Patient said she need the Diag. The number to contact patient is [phone]

## 2020-05-21 ENCOUNTER — Ambulatory Visit: Payer: Self-pay

## 2020-05-21 ENCOUNTER — Ambulatory Visit: Payer: 59 | Admitting: Orthopedic Surgery

## 2020-05-21 ENCOUNTER — Encounter: Payer: Self-pay | Admitting: Orthopedic Surgery

## 2020-05-21 DIAGNOSIS — M79604 Pain in right leg: Secondary | ICD-10-CM | POA: Diagnosis not present

## 2020-05-21 DIAGNOSIS — M25561 Pain in right knee: Secondary | ICD-10-CM | POA: Diagnosis not present

## 2020-05-21 DIAGNOSIS — M79605 Pain in left leg: Secondary | ICD-10-CM | POA: Diagnosis not present

## 2020-05-21 NOTE — Progress Notes (Signed)
Office Visit Note   Patient: Alice Bryant           Date of Birth: [birth year]           MRN: [id number] Visit Date: 05/21/2020              Requested by: Kendrick Ranch, MD [address] [place],  [place] [address] PCP: Kendrick Ranch, MD  Chief Complaint  Patient presents with  . Right Knee - Pain      HPI: Patient presents in follow-up she was given a fracture boot for the left fibular fracture she is currently wearing regular shoes and full weightbearing on both legs.  She complains of pain around the right proximal tibia.  Assessment & Plan: Visit Diagnoses:  1. Acute pain of right knee   2. Bilateral leg pain     Plan: Recommended minimizing weightbearing using crutches or a walker to unload pressure she is given a handicap parking temporary permit.  Three-view radiographs of both knees at follow-up.  Follow-Up Instructions: Return in about 4 weeks (around 06/18/2020).   Ortho Exam  Patient is alert, oriented, no adenopathy, well-dressed, normal affect, normal respiratory effort. Examination there is swelling in the right tibial plateau region there is no effusion in the knee joint medial lateral joint line is nontender to palpation collaterals cruciates are stable she is tender to palpation around the metaphysis of the tibial plateau.  Left knee again shows the proximal fibula fracture possible joint depression of the lateral tibial plateau and both areas are tender to palpation.  Imaging: XR KNEE 3 VIEW RIGHT  Result Date: 05/21/2020 Three-view radiographs of the right knee shows no evidence of a fracture no joint space depression of the tibial plateau.  Left knee AP again shows the proximal fibula fracture which will appears to be a small lateral tibial plateau compression.  No images are attached to the encounter.  Labs: No results found for: HGBA1C, ESRSEDRATE, CRP, LABURIC, REPTSTATUS, GRAMSTAIN, CULT, LABORGA   No results found  for: ALBUMIN, PREALBUMIN, LABURIC  No results found for: MG No results found for: VD25OH  No results found for: PREALBUMIN CBC EXTENDED Latest Ref Rng & Units 09/01/2016  WBC 4.0 - 10.5 K/uL 8.4  RBC 3.87 - 5.11 MIL/uL 4.87  HGB 12.0 - 15.0 g/dL 11.8(L)  HCT 36.0 - 46.0 % 37.4  PLT 150 - 400 K/uL 382     There is no height or weight on file to calculate BMI.  Orders:  Orders Placed This Encounter  Procedures  . XR KNEE 3 VIEW RIGHT   No orders of the defined types were placed in this encounter.    Procedures: No procedures performed  Clinical Data: No additional findings.  ROS:  All other systems negative, except as noted in the HPI. Review of Systems  Objective: Vital Signs: There were no vitals taken for this visit.  Specialty Comments:  No specialty comments available.  PMFS History: There are no problems to display for this patient.  Past Medical History:  Diagnosis Date  . Depression   . High cholesterol   . Migraine     Family History  Problem Relation Age of Onset  . Heart failure Father   . Heart attack Brother     Past Surgical History:  Procedure Laterality Date  . ABDOMINAL HYSTERECTOMY     Social History   Occupational History  . Not on file  Tobacco Use  . Smoking status:  Never Smoker  . Smokeless tobacco: Never Used  Substance and Sexual Activity  . Alcohol use: No  . Drug use: No  . Sexual activity: Not on file

## 2020-06-13 ENCOUNTER — Ambulatory Visit: Payer: 59 | Admitting: Orthopedic Surgery

## 2020-06-13 ENCOUNTER — Ambulatory Visit: Payer: Self-pay

## 2020-06-13 ENCOUNTER — Other Ambulatory Visit: Payer: Self-pay

## 2020-06-13 DIAGNOSIS — M25562 Pain in left knee: Secondary | ICD-10-CM | POA: Diagnosis not present

## 2020-06-13 DIAGNOSIS — M7542 Impingement syndrome of left shoulder: Secondary | ICD-10-CM | POA: Diagnosis not present

## 2020-06-13 DIAGNOSIS — M25561 Pain in right knee: Secondary | ICD-10-CM | POA: Diagnosis not present

## 2020-06-14 ENCOUNTER — Encounter: Payer: Self-pay | Admitting: Orthopedic Surgery

## 2020-06-14 NOTE — Progress Notes (Addendum)
Office Visit Note   Patient: Alice Bryant           Date of Birth: [birth year]           MRN: [id number] Visit Date: 06/13/2020              Requested by: Kendrick Ranch, MD [address] [place],  [place] [address] PCP: Kendrick Ranch, MD  Chief Complaint  Patient presents with  . Left Knee - Pain  . Right Ankle - Pain      HPI: Patient is a 65 year old woman who presents in follow-up for bilateral knee pain.  She states the right knee is essentially asymptomatic she states she still has some swelling around the left knee but states that the symptoms continue to resolve.  Patient states she has been having increased pain in the left shoulder.  Assessment & Plan: Visit Diagnoses:  1. Left knee pain, unspecified chronicity   2. Right knee pain, unspecified chronicity     Plan: We will reevaluate in 4 weeks with 2 view radiographs of the left knee.  Discussed that if her shoulder is still symptomatic we will obtain an x-ray of the left shoulder.  Follow-Up Instructions: Return in about 4 weeks (around 07/11/2020).   Ortho Exam  Patient is alert, oriented, no adenopathy, well-dressed, normal affect, normal respiratory effort. Examination patient has no pain with range of motion or palpation to the right knee no effusion.  Examination of the left knee the fibula is nontender to palpation the medial and lateral joint lines are nontender to palpation.  She has no pain with weightbearing.  Examination of the left shoulder she has pain with Neer and Hawkins impingement test pain with a drop arm test.  She has pain to palpation over the biceps tendon.  Imaging: XR Knee 1-2 Views Left  Result Date: 06/14/2020 2 view left knee shows an impaction of the lateral tibial plateau with a nondisplaced proximal fibular fracture.  XR Knee 1-2 Views Right  Result Date: 06/14/2020 2 view radiographs of the right knee shows no fractures joint spaces congruent.  No  images are attached to the encounter.  Labs: No results found for: HGBA1C, ESRSEDRATE, CRP, LABURIC, REPTSTATUS, GRAMSTAIN, CULT, LABORGA   No results found for: ALBUMIN, PREALBUMIN, LABURIC  No results found for: MG No results found for: VD25OH  No results found for: PREALBUMIN CBC EXTENDED Latest Ref Rng & Units 09/01/2016  WBC 4.0 - 10.5 K/uL 8.4  RBC 3.87 - 5.11 MIL/uL 4.87  HGB 12.0 - 15.0 g/dL 11.8(L)  HCT 36.0 - 46.0 % 37.4  PLT 150 - 400 K/uL 382     There is no height or weight on file to calculate BMI.  Orders:  Orders Placed This Encounter  Procedures  . XR Knee 1-2 Views Left  . XR Knee 1-2 Views Right   No orders of the defined types were placed in this encounter.    Procedures: No procedures performed  Clinical Data: No additional findings.  ROS:  All other systems negative, except as noted in the HPI. Review of Systems  Objective: Vital Signs: There were no vitals taken for this visit.  Specialty Comments:  No specialty comments available.  PMFS History: There are no problems to display for this patient.  Past Medical History:  Diagnosis Date  . Depression   . High cholesterol   . Migraine     Family History  Problem Relation Age of Onset  .  Heart failure Father   . Heart attack Brother     Past Surgical History:  Procedure Laterality Date  . ABDOMINAL HYSTERECTOMY     Social History   Occupational History  . Not on file  Tobacco Use  . Smoking status: Never Smoker  . Smokeless tobacco: Never Used  Substance and Sexual Activity  . Alcohol use: No  . Drug use: No  . Sexual activity: Not on file

## 2020-07-16 ENCOUNTER — Ambulatory Visit: Payer: 59 | Admitting: Orthopedic Surgery

## 2020-07-29 DIAGNOSIS — R072 Precordial pain: Secondary | ICD-10-CM | POA: Diagnosis not present

## 2020-07-29 DIAGNOSIS — R9431 Abnormal electrocardiogram [ECG] [EKG]: Secondary | ICD-10-CM | POA: Diagnosis not present

## 2020-07-29 DIAGNOSIS — K2950 Unspecified chronic gastritis without bleeding: Secondary | ICD-10-CM | POA: Diagnosis not present

## 2020-08-06 ENCOUNTER — Other Ambulatory Visit: Payer: Self-pay | Admitting: Gastroenterology

## 2020-08-06 DIAGNOSIS — K449 Diaphragmatic hernia without obstruction or gangrene: Secondary | ICD-10-CM

## 2020-08-06 DIAGNOSIS — K2100 Gastro-esophageal reflux disease with esophagitis, without bleeding: Secondary | ICD-10-CM

## 2020-08-06 DIAGNOSIS — K219 Gastro-esophageal reflux disease without esophagitis: Secondary | ICD-10-CM | POA: Diagnosis not present

## 2020-08-06 DIAGNOSIS — R9431 Abnormal electrocardiogram [ECG] [EKG]: Secondary | ICD-10-CM | POA: Diagnosis not present

## 2020-08-07 ENCOUNTER — Other Ambulatory Visit: Payer: Self-pay

## 2020-08-07 ENCOUNTER — Ambulatory Visit
Admission: RE | Admit: 2020-08-07 | Discharge: 2020-08-07 | Disposition: A | Discharge status: Home / Self Care | Payer: Medicare Other | Source: Ambulatory Visit | Attending: Gastroenterology | Admitting: Gastroenterology

## 2020-08-07 DIAGNOSIS — K219 Gastro-esophageal reflux disease without esophagitis: Secondary | ICD-10-CM | POA: Diagnosis not present

## 2020-08-07 DIAGNOSIS — K2100 Gastro-esophageal reflux disease with esophagitis, without bleeding: Secondary | ICD-10-CM

## 2020-08-07 DIAGNOSIS — K449 Diaphragmatic hernia without obstruction or gangrene: Secondary | ICD-10-CM

## 2020-08-23 DIAGNOSIS — I493 Ventricular premature depolarization: Secondary | ICD-10-CM | POA: Diagnosis not present

## 2020-08-23 DIAGNOSIS — R030 Elevated blood-pressure reading, without diagnosis of hypertension: Secondary | ICD-10-CM | POA: Diagnosis not present

## 2020-08-23 DIAGNOSIS — R9431 Abnormal electrocardiogram [ECG] [EKG]: Secondary | ICD-10-CM | POA: Diagnosis not present

## 2020-09-02 DIAGNOSIS — R9431 Abnormal electrocardiogram [ECG] [EKG]: Secondary | ICD-10-CM | POA: Diagnosis not present

## 2020-09-02 DIAGNOSIS — I493 Ventricular premature depolarization: Secondary | ICD-10-CM | POA: Diagnosis not present

## 2020-09-25 DIAGNOSIS — R9431 Abnormal electrocardiogram [ECG] [EKG]: Secondary | ICD-10-CM | POA: Diagnosis not present

## 2020-09-25 DIAGNOSIS — I493 Ventricular premature depolarization: Secondary | ICD-10-CM | POA: Diagnosis not present

## 2020-09-27 DIAGNOSIS — I493 Ventricular premature depolarization: Secondary | ICD-10-CM | POA: Diagnosis not present

## 2020-09-27 DIAGNOSIS — R9431 Abnormal electrocardiogram [ECG] [EKG]: Secondary | ICD-10-CM | POA: Diagnosis not present

## 2020-09-27 DIAGNOSIS — R030 Elevated blood-pressure reading, without diagnosis of hypertension: Secondary | ICD-10-CM | POA: Diagnosis not present

## 2020-09-27 DIAGNOSIS — I471 Supraventricular tachycardia: Secondary | ICD-10-CM | POA: Diagnosis not present

## 2020-10-31 DIAGNOSIS — R9431 Abnormal electrocardiogram [ECG] [EKG]: Secondary | ICD-10-CM | POA: Diagnosis not present

## 2020-10-31 DIAGNOSIS — I493 Ventricular premature depolarization: Secondary | ICD-10-CM | POA: Diagnosis not present

## 2020-11-08 DIAGNOSIS — Z0000 Encounter for general adult medical examination without abnormal findings: Secondary | ICD-10-CM | POA: Diagnosis not present

## 2020-11-08 DIAGNOSIS — E785 Hyperlipidemia, unspecified: Secondary | ICD-10-CM | POA: Diagnosis not present

## 2020-11-08 DIAGNOSIS — Z833 Family history of diabetes mellitus: Secondary | ICD-10-CM | POA: Diagnosis not present

## 2020-11-08 DIAGNOSIS — E559 Vitamin D deficiency, unspecified: Secondary | ICD-10-CM | POA: Diagnosis not present

## 2020-11-08 DIAGNOSIS — Z8249 Family history of ischemic heart disease and other diseases of the circulatory system: Secondary | ICD-10-CM | POA: Diagnosis not present

## 2020-11-08 DIAGNOSIS — R739 Hyperglycemia, unspecified: Secondary | ICD-10-CM | POA: Diagnosis not present

## 2020-11-08 DIAGNOSIS — G43109 Migraine with aura, not intractable, without status migrainosus: Secondary | ICD-10-CM | POA: Diagnosis not present

## 2020-11-08 DIAGNOSIS — K219 Gastro-esophageal reflux disease without esophagitis: Secondary | ICD-10-CM | POA: Diagnosis not present

## 2020-11-08 DIAGNOSIS — R5382 Chronic fatigue, unspecified: Secondary | ICD-10-CM | POA: Diagnosis not present

## 2020-12-18 DIAGNOSIS — K219 Gastro-esophageal reflux disease without esophagitis: Secondary | ICD-10-CM | POA: Diagnosis not present

## 2020-12-18 DIAGNOSIS — M62838 Other muscle spasm: Secondary | ICD-10-CM | POA: Diagnosis not present

## 2021-01-21 DIAGNOSIS — M9901 Segmental and somatic dysfunction of cervical region: Secondary | ICD-10-CM | POA: Diagnosis not present

## 2021-01-21 DIAGNOSIS — M5031 Other cervical disc degeneration,  high cervical region: Secondary | ICD-10-CM | POA: Diagnosis not present

## 2021-01-23 DIAGNOSIS — M5031 Other cervical disc degeneration,  high cervical region: Secondary | ICD-10-CM | POA: Diagnosis not present

## 2021-01-23 DIAGNOSIS — M9901 Segmental and somatic dysfunction of cervical region: Secondary | ICD-10-CM | POA: Diagnosis not present

## 2021-01-27 DIAGNOSIS — M9901 Segmental and somatic dysfunction of cervical region: Secondary | ICD-10-CM | POA: Diagnosis not present

## 2021-01-27 DIAGNOSIS — M5031 Other cervical disc degeneration,  high cervical region: Secondary | ICD-10-CM | POA: Diagnosis not present

## 2021-01-29 DIAGNOSIS — M9901 Segmental and somatic dysfunction of cervical region: Secondary | ICD-10-CM | POA: Diagnosis not present

## 2021-01-29 DIAGNOSIS — M5031 Other cervical disc degeneration,  high cervical region: Secondary | ICD-10-CM | POA: Diagnosis not present

## 2021-02-12 DIAGNOSIS — J0110 Acute frontal sinusitis, unspecified: Secondary | ICD-10-CM | POA: Diagnosis not present

## 2021-02-27 DIAGNOSIS — J0100 Acute maxillary sinusitis, unspecified: Secondary | ICD-10-CM | POA: Diagnosis not present

## 2021-02-27 DIAGNOSIS — R739 Hyperglycemia, unspecified: Secondary | ICD-10-CM | POA: Diagnosis not present

## 2021-02-27 DIAGNOSIS — E1169 Type 2 diabetes mellitus with other specified complication: Secondary | ICD-10-CM | POA: Diagnosis not present

## 2021-02-27 DIAGNOSIS — Z7984 Long term (current) use of oral hypoglycemic drugs: Secondary | ICD-10-CM | POA: Diagnosis not present

## 2021-02-27 DIAGNOSIS — R7309 Other abnormal glucose: Secondary | ICD-10-CM | POA: Diagnosis not present

## 2021-03-17 DIAGNOSIS — M8589 Other specified disorders of bone density and structure, multiple sites: Secondary | ICD-10-CM | POA: Diagnosis not present

## 2021-05-22 DIAGNOSIS — Z23 Encounter for immunization: Secondary | ICD-10-CM | POA: Diagnosis not present

## 2021-05-22 DIAGNOSIS — K219 Gastro-esophageal reflux disease without esophagitis: Secondary | ICD-10-CM | POA: Diagnosis not present

## 2021-05-22 DIAGNOSIS — E1165 Type 2 diabetes mellitus with hyperglycemia: Secondary | ICD-10-CM | POA: Diagnosis not present

## 2021-05-22 DIAGNOSIS — E785 Hyperlipidemia, unspecified: Secondary | ICD-10-CM | POA: Diagnosis not present

## 2021-05-22 DIAGNOSIS — E1169 Type 2 diabetes mellitus with other specified complication: Secondary | ICD-10-CM | POA: Diagnosis not present

## 2021-05-22 DIAGNOSIS — I10 Essential (primary) hypertension: Secondary | ICD-10-CM | POA: Diagnosis not present

## 2021-05-29 DIAGNOSIS — Z1231 Encounter for screening mammogram for malignant neoplasm of breast: Secondary | ICD-10-CM | POA: Diagnosis not present

## 2021-07-14 DIAGNOSIS — E611 Iron deficiency: Secondary | ICD-10-CM | POA: Diagnosis not present

## 2021-07-14 DIAGNOSIS — R5383 Other fatigue: Secondary | ICD-10-CM | POA: Diagnosis not present

## 2021-07-14 DIAGNOSIS — I498 Other specified cardiac arrhythmias: Secondary | ICD-10-CM | POA: Diagnosis not present

## 2021-07-17 DIAGNOSIS — E119 Type 2 diabetes mellitus without complications: Secondary | ICD-10-CM | POA: Diagnosis not present

## 2021-07-17 DIAGNOSIS — E782 Mixed hyperlipidemia: Secondary | ICD-10-CM | POA: Diagnosis not present

## 2021-07-17 DIAGNOSIS — Z712 Person consulting for explanation of examination or test findings: Secondary | ICD-10-CM | POA: Diagnosis not present

## 2021-08-02 IMAGING — RF DG UGI W/ HIGH DENSITY W/O KUB
7 series · 14 of 24 positions shown · non-contrast
Comparison: 05/03/2011 CT abdomen/pelvis

CLINICAL DATA: Gastroesophageal reflux disease with heartburn.
History of hiatal hernia.

EXAM:
UPPER GI SERIES WITH KUB
TECHNIQUE: After obtaining a scout radiograph a routine upper GI series was
performed using thin and high density barium.
FLUOROSCOPY TIME:  Fluoroscopy Time:  1 minutes 48 seconds
Radiation Exposure Index (if provided by the fluoroscopic device):
170 mGy
Number of Acquired Spot Images: 9

[Series 1: one shot · 0.14mm/px · 5 of 10 slices shown (1 of 3)]
[im 1/10]
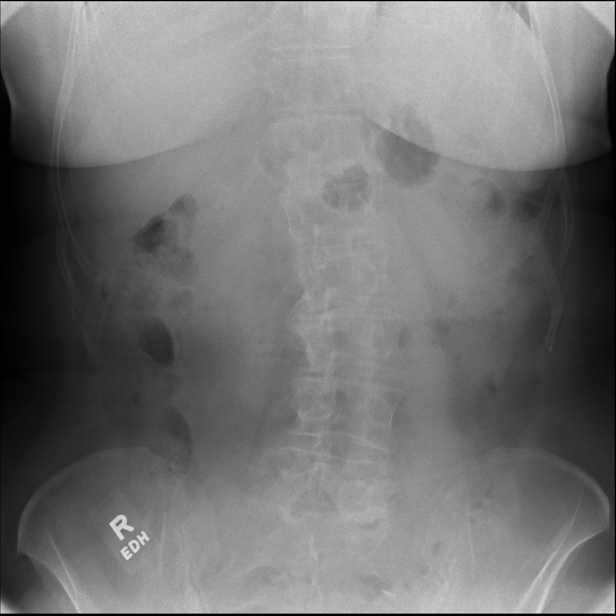
[im 3/10]
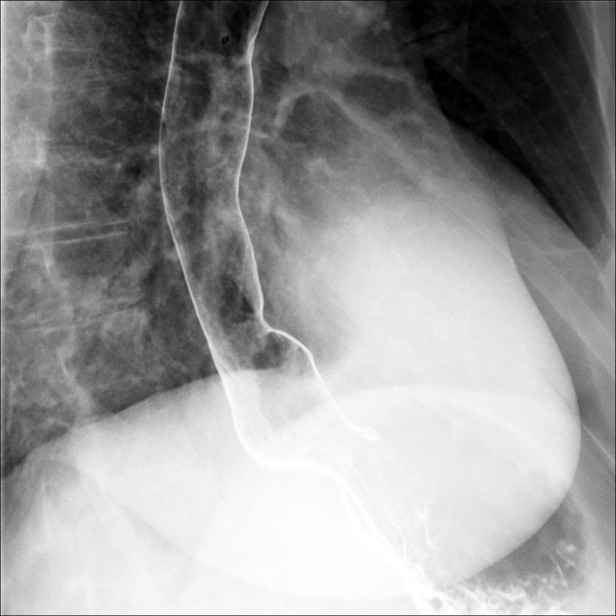
[im 6/10]
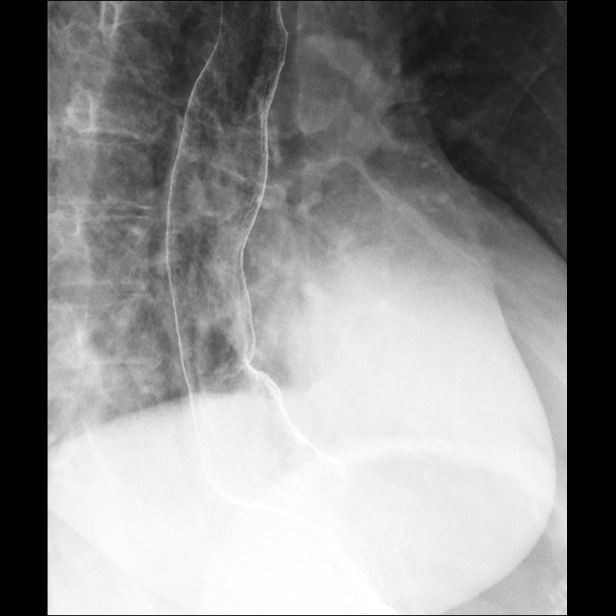
[im 8/10]
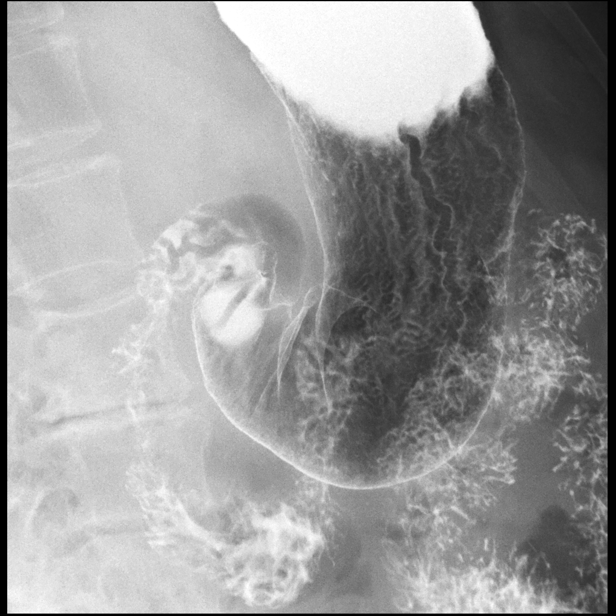
[im 9/10]
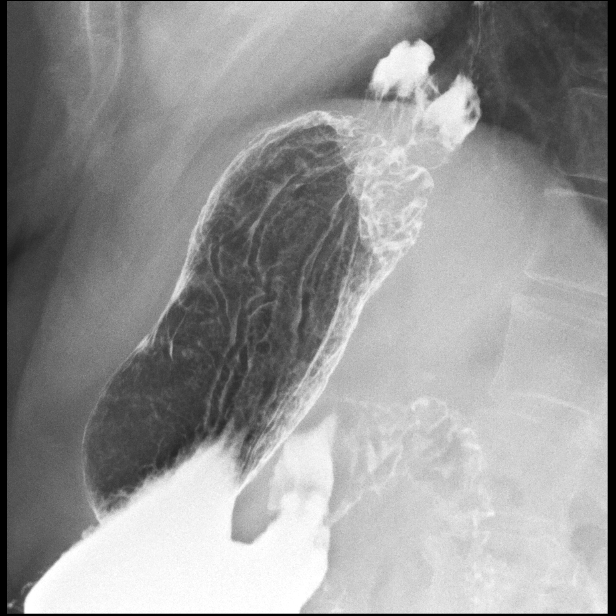

[Series 2: sequence · 2 of 31 frames shown (1 of 4)]
[frame 16/31]
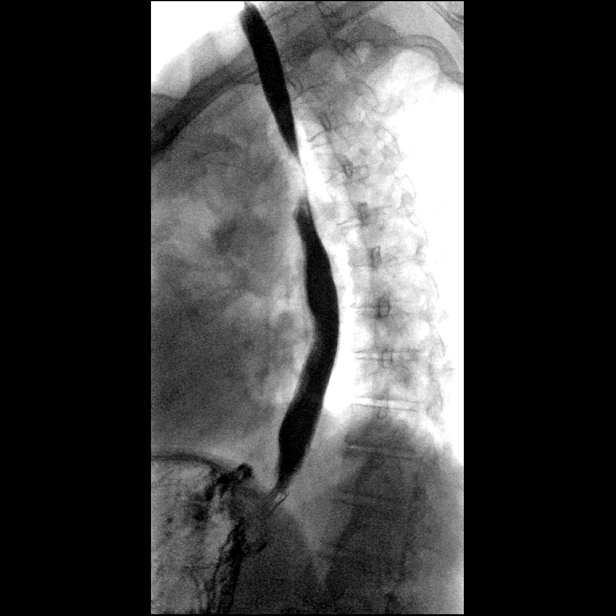
[frame 31/31  full-range]
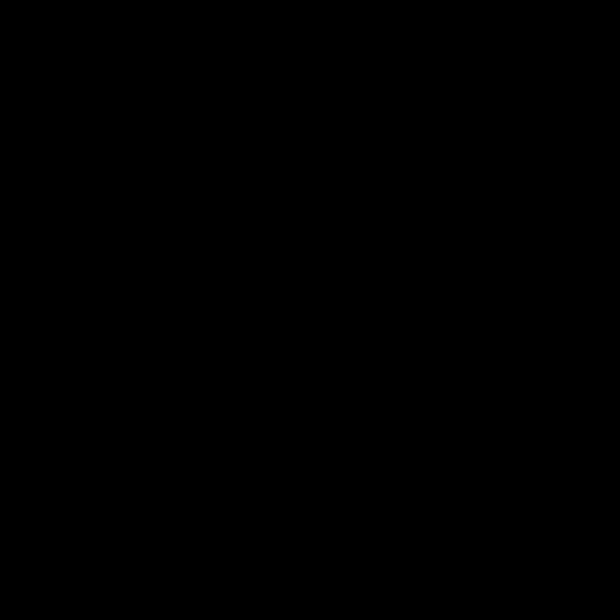

[Series 3: sequence · 2 of 80 frames shown (2 of 4)]
[frame 13/80]
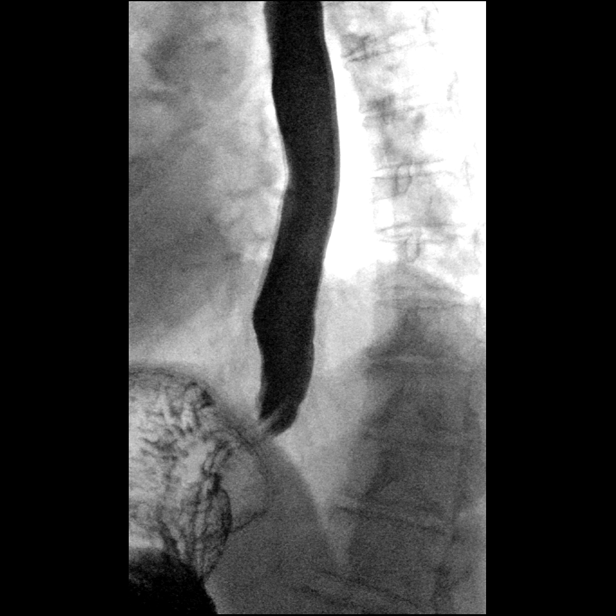
[frame 58/80]
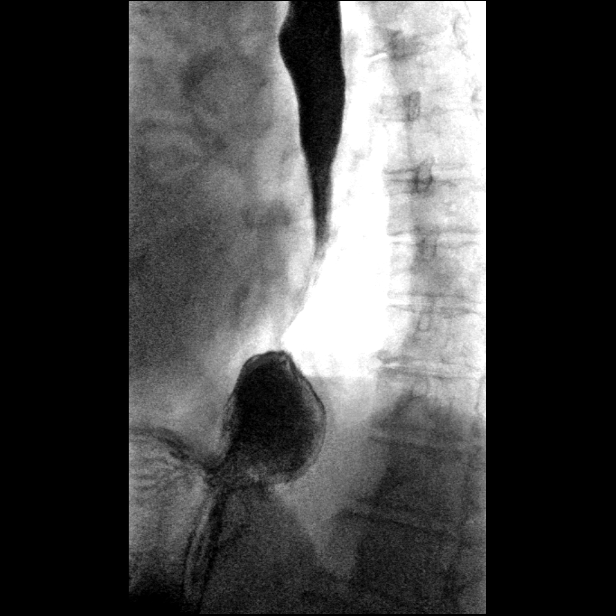

[Series 4: one shot · 0.15mm/px · 1 of 2 slices shown (2 of 3)]
[im 2/2]
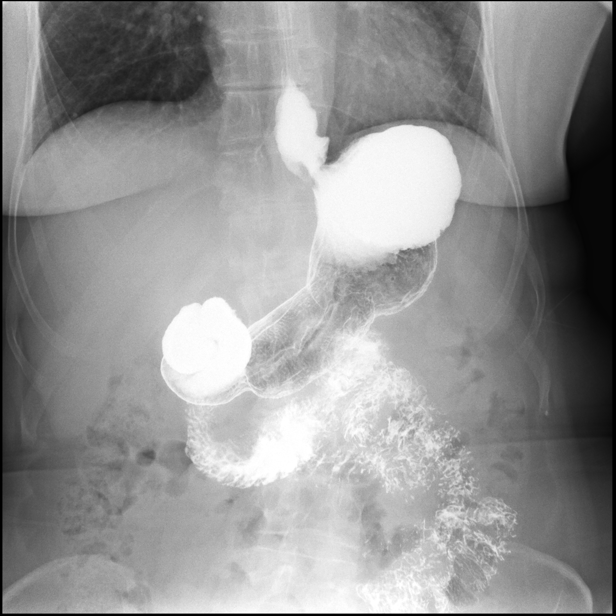

[Series 5: sequence · 2 of 19 frames shown (3 of 4)]
[frame 10/19]
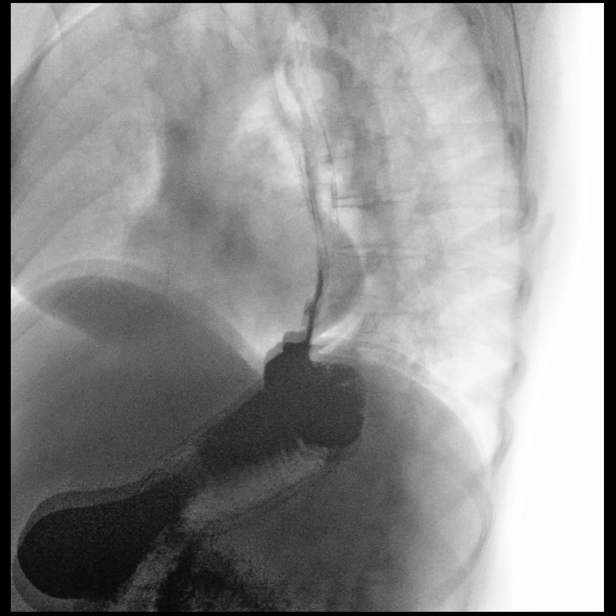
[frame 17/19]
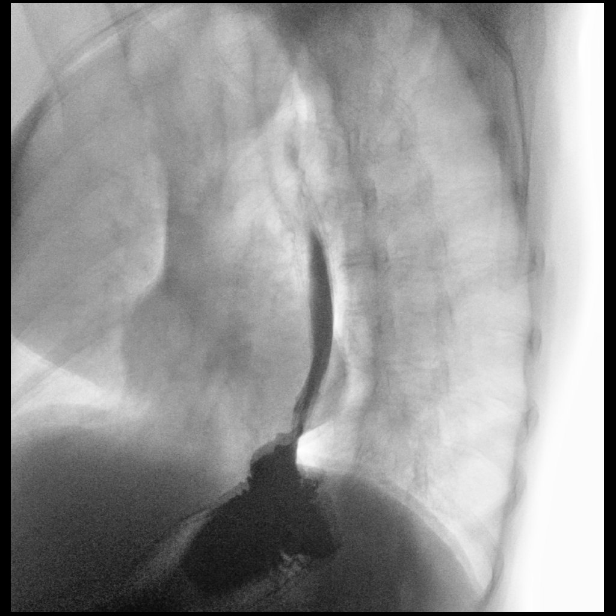

[Series 6: sequence · 1 of 32 frames shown (4 of 4)]
[frame 17/32]
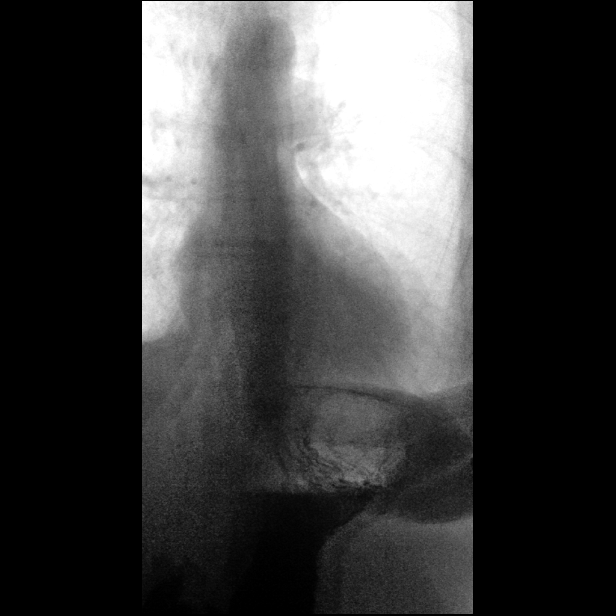

[Series 7: one shot · 1 of 1 slices shown (3 of 3)]
[im 1/1]
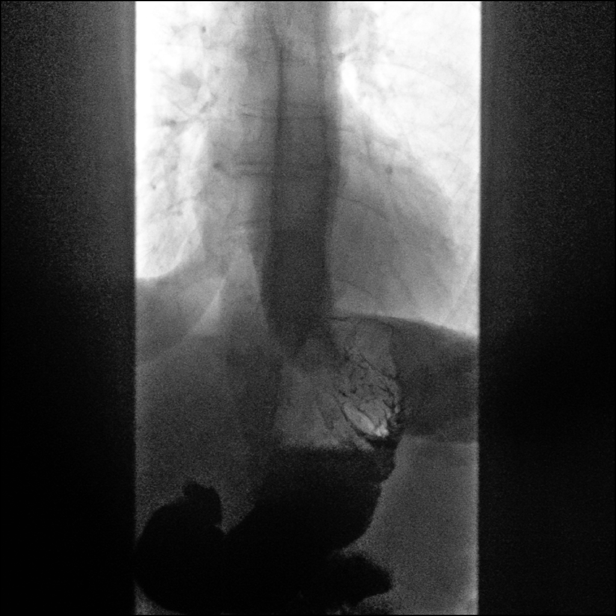

[14 of 24 positions shown; findings below may reference images not displayed]

FINDINGS: Scout radiograph demonstrates no dilated small bowel loops. Mild
colonic stool. No evidence of pneumatosis or pneumoperitoneum. No
radiopaque nephrolithiasis.

Small to moderate sliding hiatal hernia. Moderate gastroesophageal
reflux elicited to the level of the upper thoracic esophagus with
water siphon test. Mild esophageal dysmotility, characterized by
intermittent mild weakening of primary peristalsis in the thoracic
esophagus. Diffusely granular esophageal mucosa compatible with mild
reflux esophagitis. Normal esophageal distensibility, with no
evidence of esophageal mass, ulcer or stricture. 13 mm barium tablet
traversed the esophagus into the stomach without delay.

No gastric fold thickening. Normal gastric emptying. No gastric
filling defects, ulcers or significant erosions. Normal duodenal
bulb and C sweep, with no evidence of duodenal fold thickening,
ulcers, strictures or filling defects. Normal duodenal jejunal
junction to the left of the spine. Visualized proximal jejunal loops
are normal caliber and without fold thickening.
IMPRESSION: 1. Small to moderate hiatal hernia. Moderate gastroesophageal reflux
elicited.
2. Mild esophageal dysmotility, characteristic of chronic reflux
related dysmotility.
3. Mild reflux esophagitis. No evidence of esophageal stricture or
mass.
4. Otherwise normal upper GI.

## 2021-09-10 DIAGNOSIS — N390 Urinary tract infection, site not specified: Secondary | ICD-10-CM | POA: Diagnosis not present

## 2021-09-16 DIAGNOSIS — E119 Type 2 diabetes mellitus without complications: Secondary | ICD-10-CM | POA: Diagnosis not present

## 2021-09-16 DIAGNOSIS — K219 Gastro-esophageal reflux disease without esophagitis: Secondary | ICD-10-CM | POA: Diagnosis not present

## 2021-09-16 DIAGNOSIS — E782 Mixed hyperlipidemia: Secondary | ICD-10-CM | POA: Diagnosis not present

## 2021-09-16 DIAGNOSIS — F32A Depression, unspecified: Secondary | ICD-10-CM | POA: Diagnosis not present

## 2021-10-16 DIAGNOSIS — E785 Hyperlipidemia, unspecified: Secondary | ICD-10-CM | POA: Diagnosis not present

## 2021-10-16 DIAGNOSIS — I498 Other specified cardiac arrhythmias: Secondary | ICD-10-CM | POA: Diagnosis not present

## 2021-10-16 DIAGNOSIS — F32A Depression, unspecified: Secondary | ICD-10-CM | POA: Diagnosis not present

## 2021-10-16 DIAGNOSIS — I491 Atrial premature depolarization: Secondary | ICD-10-CM | POA: Diagnosis not present

## 2021-10-16 DIAGNOSIS — E119 Type 2 diabetes mellitus without complications: Secondary | ICD-10-CM | POA: Diagnosis not present

## 2021-10-16 DIAGNOSIS — E118 Type 2 diabetes mellitus with unspecified complications: Secondary | ICD-10-CM | POA: Diagnosis not present

## 2021-10-16 DIAGNOSIS — E782 Mixed hyperlipidemia: Secondary | ICD-10-CM | POA: Diagnosis not present

## 2021-10-21 DIAGNOSIS — Z794 Long term (current) use of insulin: Secondary | ICD-10-CM | POA: Diagnosis not present

## 2021-10-21 DIAGNOSIS — E119 Type 2 diabetes mellitus without complications: Secondary | ICD-10-CM | POA: Diagnosis not present

## 2021-10-21 DIAGNOSIS — H2513 Age-related nuclear cataract, bilateral: Secondary | ICD-10-CM | POA: Diagnosis not present

## 2021-11-18 DIAGNOSIS — G9339 Other post infection and related fatigue syndromes: Secondary | ICD-10-CM | POA: Diagnosis not present

## 2021-12-18 DIAGNOSIS — F32A Depression, unspecified: Secondary | ICD-10-CM | POA: Diagnosis not present

## 2021-12-18 DIAGNOSIS — K219 Gastro-esophageal reflux disease without esophagitis: Secondary | ICD-10-CM | POA: Diagnosis not present

## 2021-12-18 DIAGNOSIS — E782 Mixed hyperlipidemia: Secondary | ICD-10-CM | POA: Diagnosis not present

## 2021-12-18 DIAGNOSIS — E119 Type 2 diabetes mellitus without complications: Secondary | ICD-10-CM | POA: Diagnosis not present

## 2021-12-18 DIAGNOSIS — G9331 Postviral fatigue syndrome: Secondary | ICD-10-CM | POA: Diagnosis not present

## 2022-02-17 DIAGNOSIS — E119 Type 2 diabetes mellitus without complications: Secondary | ICD-10-CM | POA: Diagnosis not present

## 2022-02-17 DIAGNOSIS — M810 Age-related osteoporosis without current pathological fracture: Secondary | ICD-10-CM | POA: Diagnosis not present

## 2022-02-17 DIAGNOSIS — E782 Mixed hyperlipidemia: Secondary | ICD-10-CM | POA: Diagnosis not present

## 2022-02-17 DIAGNOSIS — K219 Gastro-esophageal reflux disease without esophagitis: Secondary | ICD-10-CM | POA: Diagnosis not present

## 2022-02-20 DIAGNOSIS — H18413 Arcus senilis, bilateral: Secondary | ICD-10-CM | POA: Diagnosis not present

## 2022-02-20 DIAGNOSIS — H40013 Open angle with borderline findings, low risk, bilateral: Secondary | ICD-10-CM | POA: Diagnosis not present

## 2022-02-20 DIAGNOSIS — H2513 Age-related nuclear cataract, bilateral: Secondary | ICD-10-CM | POA: Diagnosis not present

## 2022-02-20 DIAGNOSIS — H2512 Age-related nuclear cataract, left eye: Secondary | ICD-10-CM | POA: Diagnosis not present

## 2022-02-20 DIAGNOSIS — H25043 Posterior subcapsular polar age-related cataract, bilateral: Secondary | ICD-10-CM | POA: Diagnosis not present

## 2022-04-14 DIAGNOSIS — R070 Pain in throat: Secondary | ICD-10-CM | POA: Diagnosis not present

## 2024-06-07 ENCOUNTER — Emergency Department (HOSPITAL_BASED_OUTPATIENT_CLINIC_OR_DEPARTMENT_OTHER)

## 2024-06-07 ENCOUNTER — Emergency Department (HOSPITAL_BASED_OUTPATIENT_CLINIC_OR_DEPARTMENT_OTHER)
Admission: EM | Admit: 2024-06-07 | Discharge: 2024-06-07 | Disposition: A | Discharge status: Home / Self Care | Attending: Emergency Medicine | Admitting: Emergency Medicine

## 2024-06-07 ENCOUNTER — Encounter (HOSPITAL_BASED_OUTPATIENT_CLINIC_OR_DEPARTMENT_OTHER): Payer: Self-pay | Admitting: Emergency Medicine

## 2024-06-07 ENCOUNTER — Other Ambulatory Visit: Payer: Self-pay

## 2024-06-07 DIAGNOSIS — R1031 Right lower quadrant pain: Secondary | ICD-10-CM

## 2024-06-07 DIAGNOSIS — K429 Umbilical hernia without obstruction or gangrene: Secondary | ICD-10-CM | POA: Insufficient documentation

## 2024-06-07 DIAGNOSIS — R10A1 Flank pain, right side: Secondary | ICD-10-CM

## 2024-06-07 LAB — CBC WITH DIFFERENTIAL/PLATELET
Abs Immature Granulocytes: 0.01 K/uL (ref 0.00–0.07)
Basophils Absolute: 0 K/uL (ref 0.0–0.1)
Basophils Relative: 0 %
Eosinophils Absolute: 0.1 K/uL (ref 0.0–0.5)
Eosinophils Relative: 1 %
HCT: 33.5 % — ABNORMAL LOW (ref 36.0–46.0)
Hemoglobin: 10.9 g/dL — ABNORMAL LOW (ref 12.0–15.0)
Immature Granulocytes: 0 %
Lymphocytes Relative: 20 %
Lymphs Abs: 1.8 K/uL (ref 0.7–4.0)
MCH: 26.1 pg (ref 26.0–34.0)
MCHC: 32.5 g/dL (ref 30.0–36.0)
MCV: 80.1 fL (ref 80.0–100.0)
Monocytes Absolute: 0.8 K/uL (ref 0.1–1.0)
Monocytes Relative: 9 %
Neutro Abs: 6.3 K/uL (ref 1.7–7.7)
Neutrophils Relative %: 70 %
Platelets: 347 K/uL (ref 150–400)
RBC: 4.18 MIL/uL (ref 3.87–5.11)
RDW: 14.3 % (ref 11.5–15.5)
WBC: 9.1 K/uL (ref 4.0–10.5)
nRBC: 0 % (ref 0.0–0.2)

## 2024-06-07 LAB — URINALYSIS, ROUTINE W REFLEX MICROSCOPIC
Bilirubin Urine: NEGATIVE
Glucose, UA: NEGATIVE mg/dL
Hgb urine dipstick: NEGATIVE
Ketones, ur: NEGATIVE mg/dL
Leukocytes,Ua: NEGATIVE
Nitrite: NEGATIVE
Protein, ur: NEGATIVE mg/dL
Specific Gravity, Urine: 1.01 (ref 1.005–1.030)
pH: 7 (ref 5.0–8.0)

## 2024-06-07 LAB — COMPREHENSIVE METABOLIC PANEL WITH GFR
ALT: 16 U/L (ref 0–44)
AST: 16 U/L (ref 15–41)
Albumin: 3.9 g/dL (ref 3.5–5.0)
Alkaline Phosphatase: 109 U/L (ref 38–126)
Anion gap: 9 (ref 5–15)
BUN: 14 mg/dL (ref 8–23)
CO2: 29 mmol/L (ref 22–32)
Calcium: 9.5 mg/dL (ref 8.9–10.3)
Chloride: 103 mmol/L (ref 98–111)
Creatinine, Ser: 0.72 mg/dL (ref 0.44–1.00)
GFR, Estimated: >60 mL/min
Glucose, Bld: 129 mg/dL — ABNORMAL HIGH (ref 70–99)
Potassium: 4.2 mmol/L (ref 3.5–5.1)
Sodium: 141 mmol/L (ref 135–145)
Total Bilirubin: 0.5 mg/dL (ref 0.0–1.2)
Total Protein: 7.5 g/dL (ref 6.5–8.1)

## 2024-06-07 MED ORDER — FENTANYL CITRATE (PF) 50 MCG/ML IJ SOSY
50.0000 ug | PREFILLED_SYRINGE | Freq: Once | INTRAMUSCULAR | Status: AC
  Administered 2024-06-07: 50 ug via INTRAVENOUS
  Filled 2024-06-07: qty 1

## 2024-06-07 MED ORDER — IOHEXOL 300 MG/ML  SOLN
100.0000 mL | Freq: Once | INTRAMUSCULAR | Status: AC | PRN
  Administered 2024-06-07: 100 mL via INTRAVENOUS

## 2024-06-07 NOTE — ED Triage Notes (Signed)
 Reports R flank pain x 2 days. Worse with movement. States recent flu dx with coughing.

## 2024-06-07 NOTE — ED Provider Notes (Signed)
 " [facility] EMERGENCY DE[facility]MENT AT [facility] Provider Note   CSN: [id number] Arrival date & time: 06/07/24  0827     Patient presents with: Flank Pain   Alice Bryant is a 69 y.o. female.    Flank Pain     69 year old female with medical history significant for depression, migraine headaches, HLD presenting to the emergency department with chief complaint of right lower quadrant abdominal pain with associated right-sided flank pain.  The patient states that she was recently diagnosed with the flu and has been coughing.  She endorses sharp pain in the right lower quadrant for the past 2 days with some associated chills, no fevers.  She had been improving from the flu.  She denies any urinary symptoms, no burning while urinating or frequency.  Prior to Admission medications  Medication Sig Start Date End Date Taking? Authorizing Provider  cholecalciferol (VITAMIN D) 1000 UNITS tablet Take 1,000 Units by mouth daily.      [provider]  Omega-3 Fatty Acids (FISH OIL) 500 MG CAPS Take 1 capsule by mouth daily.    [provider]  omeprazole (PRILOSEC) 20 MG capsule Take 20 mg by mouth 2 (two) times daily. 10/28/17   [provider]  ranitidine (ZANTAC) 150 MG tablet Take 150 mg by mouth 2 (two) times daily.    [provider]  sertraline (ZOLOFT) 100 MG tablet Take 150 mg by mouth daily. 08/06/16   [provider]  vitamin B-12 (CYANOCOBALAMIN) 500 MCG tablet Take 500 mcg by mouth daily.    [provider]    Allergies: Cetirizine, Ibuprofen, Morphine  and codeine, and Prednisone    Review of Systems  Genitourinary:  Positive for flank pain.  All other systems reviewed and are negative.   Updated Vital Signs BP 138/69   Pulse 79   Temp 98.7 F (37.1 C) (Oral)   Resp 16   SpO2 95%   Physical Exam Vitals and nursing note reviewed.  Constitutional:      General: She is not in acute distress.    Appearance: She is  well-developed.  HENT:     Head: Normocephalic and atraumatic.  Eyes:     Conjunctiva/sclera: Conjunctivae normal.  Cardiovascular:     Rate and Rhythm: Normal rate and regular rhythm.     Heart sounds: No murmur heard. Pulmonary:     Effort: Pulmonary effort is normal. No respiratory distress.     Breath sounds: Normal breath sounds.  Abdominal:     Palpations: Abdomen is soft.     Tenderness: There is abdominal tenderness in the right lower quadrant. There is no guarding.     Comments: No rash appreciated  Musculoskeletal:        General: No swelling.     Cervical back: Neck supple.  Skin:    General: Skin is warm and dry.     Capillary Refill: Capillary refill takes less than 2 seconds.  Neurological:     Mental Status: She is alert.  Psychiatric:        Mood and Affect: Mood normal.     (all labs ordered are listed, but only abnormal results are displayed) Labs Reviewed  CBC WITH DIFFERENTIAL/PLATELET - Abnormal; Notable for the following components:      Result Value   Hemoglobin 10.9 (*)    HCT 33.5 (*)    All other components within normal limits  COMPREHENSIVE METABOLIC PANEL WITH GFR - Abnormal; Notable for the following components:  Glucose, Bld 129 (*)    All other components within normal limits  URINALYSIS, ROUTINE W REFLEX MICROSCOPIC - Abnormal; Notable for the following components:   Color, Urine STRAW (*)    All other components within normal limits    EKG: None  Radiology: CT ABDOMEN PELVIS W CONTRAST Result Date: 06/07/2024 CLINICAL DATA:  Right lower quadrant pain with flank pain as stones suspected. EXAM: CT ABDOMEN AND PELVIS WITH CONTRAST TECHNIQUE: Multidetector CT imaging of the abdomen and pelvis was performed using the standard protocol following bolus administration of intravenous contrast. RADIATION DOSE REDUCTION: This exam was performed according to the departmental dose-optimization program which includes automated exposure control,  adjustment of the mA and/or kV according to patient size and/or use of iterative reconstruction technique. CONTRAST:  OMNIPAQUE  IOHEXOL  300 MG/ML  SOLN COMPARISON:  CT chest 12/09/2017 FINDINGS: Lower chest: Small to moderate size hiatal hernia unchanged. Linear density left base likely atelectasis. Hepatobiliary: Liver, gallbladder and biliary tree are normal. Pancreas: Normal. Spleen: Normal. Adrenals/Urinary Tract: Adrenal glands are normal. Kidneys are normal size without hydronephrosis or nephrolithiasis. Ureters and bladder are normal. Stomach/Bowel: Hiatal hernia as the stomach is otherwise unremarkable. Small bowel is normal. Appendix is normal. Very minimal diverticulosis of the colon. Vascular/Lymphatic: Minimal calcified plaque over the abdominal aorta which is normal in caliber. Moderate calcified plaque at the origin of the left renal artery which is otherwise patent. No significant adenopathy. Reproductive: Status post hysterectomy. No adnexal masses. Other: No free fluid or focal inflammatory change. Small umbilical hernia containing only peritoneal fat. Musculoskeletal: Curvature of the lumbar spine convex left. Degenerative changes of the spine and hips. IMPRESSION: 1. No acute findings in the abdomen/pelvis. 2. Small to moderate size hiatal hernia unchanged. 3. Very minimal colonic diverticulosis. 4. Small umbilical hernia containing only peritoneal fat. 5. Aortic atherosclerosis. Aortic Atherosclerosis (ICD10-I70.0). Electronically Signed   By: Toribio Agreste M.D.   On: 06/07/2024 11:00     Procedures   Medications Ordered in the ED  fentaNYL  (SUBLIMAZE ) injection 50 mcg (50 mcg Intravenous Given 06/07/24 0924)  iohexol  (OMNIPAQUE ) 300 MG/ML solution 100 mL (100 mLs Intravenous Contrast Given 06/07/24 1043)                                    Medical Decision Making Amount and/or Complexity of Data Reviewed Labs: ordered. Radiology: ordered.  Risk Prescription drug  management.    69 year old female with medical history significant for depression, migraine headaches, HLD presenting to the emergency department with chief complaint of right lower quadrant abdominal pain with associated right-sided flank pain.  The patient states that she was recently diagnosed with the flu and has been coughing.  She endorses sharp pain in the right lower quadrant for the past 2 days with some associated chills, no fevers.  She had been improving from the flu.  She denies any urinary symptoms, no burning while urinating or frequency.  Medical Decision Making:   Alice Bryant is a 69 y.o. female who presented to the ED today with abdominal pain, detailed above.    Patient placed on continuous vitals and telemetry monitoring while in ED which was reviewed periodically.  Complete initial physical exam performed, notably the patient  was TTP in the RLQ.     Reviewed and confirmed nursing documentation for past medical history, family history, social history.    Initial Assessment:   With the patient's presentation  of abdominal pain, most likely diagnosis is hernia, diverticulitis. Other diagnoses were considered including (but not limited to) gastroenteritis, colitis, small bowel obstruction, appendicitis, cholecystitis, pancreatitis, nephrolithiasis, UTI, pyelonephritis, diverticulitis.  These are considered less likely due to history of present illness and physical exam findings.      Initial Plan:  CBC/CMP to evaluate for underlying infectious/metabolic etiology for patient's abdominal pain  CTAB/Pelvis with contrast to evaluate for structural/surgical etiology of patients' severe abdominal pain.  Urinalysis and repeat physical assessment to evaluate for UTI/Pyelonpehritis  Empiric management of symptoms with escalating pain control and antiemetics as needed.   Initial Study Results:   Laboratory  All laboratory results reviewed without evidence of clinically relevant  pathology.   Exceptions include:      Radiology   CT ABDOMEN PELVIS W CONTRAST Result Date: 06/07/2024 CLINICAL DATA:  Right lower quadrant pain with flank pain as stones suspected. EXAM: CT ABDOMEN AND PELVIS WITH CONTRAST TECHNIQUE: Multidetector CT imaging of the abdomen and pelvis was performed using the standard protocol following bolus administration of intravenous contrast. RADIATION DOSE REDUCTION: This exam was performed according to the departmental dose-optimization program which includes automated exposure control, adjustment of the mA and/or kV according to patient size and/or use of iterative reconstruction technique. CONTRAST:  OMNIPAQUE  IOHEXOL  300 MG/ML  SOLN COMPARISON:  CT chest 12/09/2017 FINDINGS: Lower chest: Small to moderate size hiatal hernia unchanged. Linear density left base likely atelectasis. Hepatobiliary: Liver, gallbladder and biliary tree are normal. Pancreas: Normal. Spleen: Normal. Adrenals/Urinary Tract: Adrenal glands are normal. Kidneys are normal size without hydronephrosis or nephrolithiasis. Ureters and bladder are normal. Stomach/Bowel: Hiatal hernia as the stomach is otherwise unremarkable. Small bowel is normal. Appendix is normal. Very minimal diverticulosis of the colon. Vascular/Lymphatic: Minimal calcified plaque over the abdominal aorta which is normal in caliber. Moderate calcified plaque at the origin of the left renal artery which is otherwise patent. No significant adenopathy. Reproductive: Status post hysterectomy. No adnexal masses. Other: No free fluid or focal inflammatory change. Small umbilical hernia containing only peritoneal fat. Musculoskeletal: Curvature of the lumbar spine convex left. Degenerative changes of the spine and hips. IMPRESSION: 1. No acute findings in the abdomen/pelvis. 2. Small to moderate size hiatal hernia unchanged. 3. Very minimal colonic diverticulosis. 4. Small umbilical hernia containing only peritoneal fat. 5. Aortic  atherosclerosis. Aortic Atherosclerosis (ICD10-I70.0). Electronically Signed   By: Toribio Agreste M.D.   On: 06/07/2024 11:00       Final Reassessment and Plan:   Upon reassessment, the patient was feeling symptomatically improved reassuring workup at this time with normal urinalysis, normal laboratory evaluation.  Imaging unremarkable showed evidence of umbilical hernia, containing only peritoneal fat, very minimal colonic diverticulosis.  Moderate size hiatal hernia was unchanged.  Patient is tolerating oral intake and feeling improved, overall stable for discharge at this time.      Final diagnoses:  Right lower quadrant abdominal pain  Right flank pain  Periumbilical hernia    ED Discharge Orders     None          Jerrol Agent, MD 06/07/24 1354  "

## 2024-06-07 NOTE — Discharge Instructions (Addendum)
 Your CT imaging and laboratory evaluation was overall reassuring and your urine was negative.  Recommend continue oral rehydration at home.  If you develop a rash this would be more concerning for shingles.  If you develop fever chills with worsening right lower quadrant abdominal pain this would be more concerning for diverticulitis and would warrant repeat dilation in the emergency department.  Please return to the ER for any subsequent concerns otherwise follow-up with your primary care provider routinely.

## 2024-06-07 NOTE — ED Notes (Signed)
 Reviewed AVS/discharge instruction with patient. Time allotted for and all questions answered. Patient is agreeable for d/c and escorted to ed exit by staff.
